# Patient Record
Sex: Female | Born: 1940 | Race: White | Hispanic: No | Marital: Married | Smoking: Never smoker
Health system: Southern US, Community
[De-identification: ages and names within clinical notes are randomized; demographics above are authoritative.]

---

## 2020-01-15 DIAGNOSIS — N1831 Chronic kidney disease, stage 3a: Secondary | ICD-10-CM | POA: Insufficient documentation

## 2020-01-15 DIAGNOSIS — E782 Mixed hyperlipidemia: Secondary | ICD-10-CM | POA: Insufficient documentation

## 2020-01-15 DIAGNOSIS — R7989 Other specified abnormal findings of blood chemistry: Secondary | ICD-10-CM | POA: Insufficient documentation

## 2020-01-15 DIAGNOSIS — G43909 Migraine, unspecified, not intractable, without status migrainosus: Secondary | ICD-10-CM | POA: Insufficient documentation

## 2020-01-15 DIAGNOSIS — N3946 Mixed incontinence: Secondary | ICD-10-CM | POA: Insufficient documentation

## 2020-01-15 DIAGNOSIS — F419 Anxiety disorder, unspecified: Secondary | ICD-10-CM | POA: Insufficient documentation

## 2020-01-17 DIAGNOSIS — G8929 Other chronic pain: Secondary | ICD-10-CM | POA: Insufficient documentation

## 2020-01-17 DIAGNOSIS — E039 Hypothyroidism, unspecified: Secondary | ICD-10-CM | POA: Insufficient documentation

## 2020-01-17 DIAGNOSIS — M5441 Lumbago with sciatica, right side: Secondary | ICD-10-CM | POA: Insufficient documentation

## 2020-01-17 DIAGNOSIS — M25559 Pain in unspecified hip: Secondary | ICD-10-CM | POA: Insufficient documentation

## 2020-01-17 DIAGNOSIS — G629 Polyneuropathy, unspecified: Secondary | ICD-10-CM | POA: Insufficient documentation

## 2020-05-03 DIAGNOSIS — K219 Gastro-esophageal reflux disease without esophagitis: Secondary | ICD-10-CM | POA: Insufficient documentation

## 2020-07-04 ENCOUNTER — Inpatient Hospital Stay (HOSPITAL_COMMUNITY)
Admission: EM | Admit: 2020-07-04 | Discharge: 2020-07-08 | DRG: 522 | Disposition: A | Discharge status: Skilled Nursing Facility | Payer: Medicare Other | Attending: Family Medicine | Admitting: Family Medicine

## 2020-07-04 ENCOUNTER — Emergency Department (HOSPITAL_COMMUNITY): Payer: Medicare Other

## 2020-07-04 ENCOUNTER — Encounter (HOSPITAL_COMMUNITY): Payer: Self-pay | Admitting: Emergency Medicine

## 2020-07-04 ENCOUNTER — Other Ambulatory Visit: Payer: Self-pay

## 2020-07-04 DIAGNOSIS — F419 Anxiety disorder, unspecified: Secondary | ICD-10-CM | POA: Diagnosis present

## 2020-07-04 DIAGNOSIS — G8929 Other chronic pain: Secondary | ICD-10-CM | POA: Diagnosis present

## 2020-07-04 DIAGNOSIS — M5441 Lumbago with sciatica, right side: Secondary | ICD-10-CM | POA: Diagnosis present

## 2020-07-04 DIAGNOSIS — I10 Essential (primary) hypertension: Secondary | ICD-10-CM

## 2020-07-04 DIAGNOSIS — Z7984 Long term (current) use of oral hypoglycemic drugs: Secondary | ICD-10-CM

## 2020-07-04 DIAGNOSIS — G5791 Unspecified mononeuropathy of right lower limb: Secondary | ICD-10-CM | POA: Diagnosis present

## 2020-07-04 DIAGNOSIS — R0602 Shortness of breath: Secondary | ICD-10-CM

## 2020-07-04 DIAGNOSIS — K219 Gastro-esophageal reflux disease without esophagitis: Secondary | ICD-10-CM | POA: Diagnosis present

## 2020-07-04 DIAGNOSIS — E782 Mixed hyperlipidemia: Secondary | ICD-10-CM | POA: Diagnosis present

## 2020-07-04 DIAGNOSIS — R7303 Prediabetes: Secondary | ICD-10-CM | POA: Diagnosis present

## 2020-07-04 DIAGNOSIS — W010XXA Fall on same level from slipping, tripping and stumbling without subsequent striking against object, initial encounter: Secondary | ICD-10-CM | POA: Diagnosis present

## 2020-07-04 DIAGNOSIS — J45909 Unspecified asthma, uncomplicated: Secondary | ICD-10-CM | POA: Diagnosis present

## 2020-07-04 DIAGNOSIS — E039 Hypothyroidism, unspecified: Secondary | ICD-10-CM | POA: Diagnosis present

## 2020-07-04 DIAGNOSIS — Z20822 Contact with and (suspected) exposure to covid-19: Secondary | ICD-10-CM | POA: Diagnosis present

## 2020-07-04 DIAGNOSIS — F32A Depression, unspecified: Secondary | ICD-10-CM | POA: Diagnosis present

## 2020-07-04 DIAGNOSIS — Z79899 Other long term (current) drug therapy: Secondary | ICD-10-CM | POA: Diagnosis not present

## 2020-07-04 DIAGNOSIS — Z79890 Hormone replacement therapy (postmenopausal): Secondary | ICD-10-CM

## 2020-07-04 DIAGNOSIS — Z96642 Presence of left artificial hip joint: Secondary | ICD-10-CM

## 2020-07-04 DIAGNOSIS — M25552 Pain in left hip: Secondary | ICD-10-CM | POA: Diagnosis present

## 2020-07-04 DIAGNOSIS — Z96641 Presence of right artificial hip joint: Secondary | ICD-10-CM | POA: Diagnosis present

## 2020-07-04 DIAGNOSIS — S72002A Fracture of unspecified part of neck of left femur, initial encounter for closed fracture: Principal | ICD-10-CM

## 2020-07-04 DIAGNOSIS — I9581 Postprocedural hypotension: Secondary | ICD-10-CM | POA: Diagnosis not present

## 2020-07-04 DIAGNOSIS — I129 Hypertensive chronic kidney disease with stage 1 through stage 4 chronic kidney disease, or unspecified chronic kidney disease: Secondary | ICD-10-CM | POA: Diagnosis present

## 2020-07-04 DIAGNOSIS — N1831 Chronic kidney disease, stage 3a: Secondary | ICD-10-CM | POA: Diagnosis present

## 2020-07-04 DIAGNOSIS — N3946 Mixed incontinence: Secondary | ICD-10-CM | POA: Diagnosis present

## 2020-07-04 LAB — CBC WITH DIFFERENTIAL/PLATELET
Abs Immature Granulocytes: 0.06 10*3/uL (ref 0.00–0.07)
Basophils Absolute: 0.1 10*3/uL (ref 0.0–0.1)
Basophils Relative: 1 %
Eosinophils Absolute: 0.1 10*3/uL (ref 0.0–0.5)
Eosinophils Relative: 2 %
HCT: 34.8 % — ABNORMAL LOW (ref 36.0–46.0)
Hemoglobin: 10.8 g/dL — ABNORMAL LOW (ref 12.0–15.0)
Immature Granulocytes: 1 %
Lymphocytes Relative: 21 %
Lymphs Abs: 1.6 10*3/uL (ref 0.7–4.0)
MCH: 26 pg (ref 26.0–34.0)
MCHC: 31 g/dL (ref 30.0–36.0)
MCV: 83.7 fL (ref 80.0–100.0)
Monocytes Absolute: 1 10*3/uL (ref 0.1–1.0)
Monocytes Relative: 13 %
Neutro Abs: 4.7 10*3/uL (ref 1.7–7.7)
Neutrophils Relative %: 62 %
Platelets: 286 10*3/uL (ref 150–400)
RBC: 4.16 MIL/uL (ref 3.87–5.11)
RDW: 15.8 % — ABNORMAL HIGH (ref 11.5–15.5)
WBC: 7.6 10*3/uL (ref 4.0–10.5)
nRBC: 0 % (ref 0.0–0.2)

## 2020-07-04 LAB — BASIC METABOLIC PANEL
Anion gap: 9 (ref 5–15)
BUN: 13 mg/dL (ref 8–23)
CO2: 22 mmol/L (ref 22–32)
Calcium: 9.1 mg/dL (ref 8.9–10.3)
Chloride: 102 mmol/L (ref 98–111)
Creatinine, Ser: 0.95 mg/dL (ref 0.44–1.00)
GFR, Estimated: >60 mL/min (ref >60)
Glucose, Bld: 82 mg/dL (ref 70–99)
Potassium: 4.8 mmol/L (ref 3.5–5.1)
Sodium: 133 mmol/L — ABNORMAL LOW (ref 135–145)

## 2020-07-04 LAB — RESP PANEL BY RT-PCR (FLU A&B, COVID) ARPGX2
Influenza A by PCR: NEGATIVE
Influenza B by PCR: NEGATIVE
SARS Coronavirus 2 by RT PCR: NEGATIVE

## 2020-07-04 LAB — HEMOGLOBIN A1C
Hgb A1c MFr Bld: 6.1 % — ABNORMAL HIGH (ref 4.8–5.6)
Mean Plasma Glucose: 128.37 mg/dL

## 2020-07-04 LAB — GLUCOSE, CAPILLARY: Glucose-Capillary: 118 mg/dL — ABNORMAL HIGH (ref 70–99)

## 2020-07-04 LAB — CBG MONITORING, ED: Glucose-Capillary: 92 mg/dL (ref 70–99)

## 2020-07-04 MED ORDER — DULOXETINE HCL 60 MG PO CPEP
60.0000 mg | ORAL_CAPSULE | Freq: Every day | ORAL | Status: DC
  Administered 2020-07-06 – 2020-07-08 (×3): 60 mg via ORAL
  Filled 2020-07-04 (×4): qty 1

## 2020-07-04 MED ORDER — ENOXAPARIN SODIUM 40 MG/0.4ML ~~LOC~~ SOLN: 40.0000 mg | SUBCUTANEOUS | Status: DC

## 2020-07-04 MED ORDER — HYDROCODONE-ACETAMINOPHEN 5-325 MG PO TABS
1.0000 | ORAL_TABLET | Freq: Four times a day (QID) | ORAL | Status: DC | PRN
  Administered 2020-07-04: 2 via ORAL
  Administered 2020-07-06: 1 via ORAL
  Administered 2020-07-06 (×2): 2 via ORAL
  Administered 2020-07-08 (×2): 1 via ORAL
  Filled 2020-07-04 (×2): qty 2
  Filled 2020-07-04: qty 1
  Filled 2020-07-04 (×2): qty 2
  Filled 2020-07-04: qty 1

## 2020-07-04 MED ORDER — LIDOCAINE-EPINEPHRINE 1 %-1:100000 IJ SOLN
10.0000 mL | Freq: Once | INTRAMUSCULAR | Status: DC
  Filled 2020-07-04: qty 10

## 2020-07-04 MED ORDER — METHOCARBAMOL 500 MG PO TABS
500.0000 mg | ORAL_TABLET | Freq: Four times a day (QID) | ORAL | Status: DC | PRN
  Administered 2020-07-04 – 2020-07-06 (×2): 500 mg via ORAL
  Filled 2020-07-04 (×2): qty 1

## 2020-07-04 MED ORDER — GABAPENTIN 100 MG PO CAPS
200.0000 mg | ORAL_CAPSULE | Freq: Every day | ORAL | Status: DC
Start: 2020-07-05 — End: 2020-07-08
  Administered 2020-07-06 – 2020-07-08 (×3): 200 mg via ORAL
  Filled 2020-07-04 (×4): qty 2

## 2020-07-04 MED ORDER — BISACODYL 5 MG PO TBEC: 5.0000 mg | DELAYED_RELEASE_TABLET | Freq: Every day | ORAL | Status: DC | PRN

## 2020-07-04 MED ORDER — HYDROMORPHONE HCL 1 MG/ML IJ SOLN
1.0000 mg | Freq: Once | INTRAMUSCULAR | Status: AC
Start: 2020-07-04 — End: 2020-07-04
  Administered 2020-07-04: 1 mg via INTRAVENOUS
  Filled 2020-07-04: qty 1

## 2020-07-04 MED ORDER — PRAVASTATIN SODIUM 10 MG PO TABS
20.0000 mg | ORAL_TABLET | Freq: Every day | ORAL | Status: DC
  Administered 2020-07-04 – 2020-07-07 (×4): 20 mg via ORAL
  Filled 2020-07-04 (×4): qty 2

## 2020-07-04 MED ORDER — INSULIN ASPART 100 UNIT/ML ~~LOC~~ SOLN: 0.0000 [IU] | Freq: Every day | SUBCUTANEOUS | Status: DC

## 2020-07-04 MED ORDER — ONDANSETRON HCL 4 MG/2ML IJ SOLN
4.0000 mg | Freq: Once | INTRAMUSCULAR | Status: AC
  Administered 2020-07-04: 4 mg via INTRAVENOUS
  Filled 2020-07-04: qty 2

## 2020-07-04 MED ORDER — INSULIN ASPART 100 UNIT/ML ~~LOC~~ SOLN: 0.0000 [IU] | Freq: Three times a day (TID) | SUBCUTANEOUS | Status: DC

## 2020-07-04 MED ORDER — SENNOSIDES-DOCUSATE SODIUM 8.6-50 MG PO TABS: 1.0000 | ORAL_TABLET | Freq: Every evening | ORAL | Status: DC | PRN

## 2020-07-04 MED ORDER — LIDOCAINE-EPINEPHRINE (PF) 2 %-1:200000 IJ SOLN
INTRAMUSCULAR | Status: AC
  Administered 2020-07-04: 20 mL
  Filled 2020-07-04: qty 20

## 2020-07-04 MED ORDER — HYDROMORPHONE HCL 1 MG/ML IJ SOLN
1.0000 mg | Freq: Once | INTRAMUSCULAR | Status: AC
  Administered 2020-07-04: 1 mg via INTRAVENOUS
  Filled 2020-07-04: qty 1

## 2020-07-04 MED ORDER — LEVOTHYROXINE SODIUM 137 MCG PO TABS
137.0000 ug | ORAL_TABLET | Freq: Every day | ORAL | Status: DC
  Administered 2020-07-06 – 2020-07-08 (×3): 137 ug via ORAL
  Filled 2020-07-04 (×3): qty 1

## 2020-07-04 MED ORDER — METHOCARBAMOL 1000 MG/10ML IJ SOLN
500.0000 mg | Freq: Four times a day (QID) | INTRAVENOUS | Status: DC | PRN
  Administered 2020-07-05: 500 mg via INTRAVENOUS
  Filled 2020-07-04: qty 5

## 2020-07-04 MED ORDER — B COMPLEX-C PO TABS
1.0000 | ORAL_TABLET | Freq: Every day | ORAL | Status: DC
  Administered 2020-07-06 – 2020-07-08 (×3): 1 via ORAL
  Filled 2020-07-04 (×4): qty 1

## 2020-07-04 MED ORDER — MORPHINE SULFATE (PF) 2 MG/ML IV SOLN
1.0000 mg | INTRAVENOUS | Status: DC | PRN
Start: 2020-07-04 — End: 2020-07-07
  Administered 2020-07-05 – 2020-07-07 (×2): 1 mg via INTRAVENOUS
  Filled 2020-07-04 (×2): qty 1

## 2020-07-04 MED ORDER — PANTOPRAZOLE SODIUM 40 MG PO TBEC
40.0000 mg | DELAYED_RELEASE_TABLET | Freq: Every day | ORAL | Status: DC
  Administered 2020-07-06 – 2020-07-08 (×3): 40 mg via ORAL
  Filled 2020-07-04 (×4): qty 1

## 2020-07-04 MED ORDER — MORPHINE SULFATE (PF) 2 MG/ML IV SOLN: 0.5000 mg | INTRAVENOUS | Status: DC | PRN

## 2020-07-04 MED ORDER — VITAMIN D 25 MCG (1000 UNIT) PO TABS
2000.0000 [IU] | ORAL_TABLET | Freq: Every day | ORAL | Status: DC
  Administered 2020-07-06 – 2020-07-08 (×3): 2000 [IU] via ORAL
  Filled 2020-07-04 (×4): qty 2

## 2020-07-04 MED ORDER — AMITRIPTYLINE HCL 10 MG PO TABS
10.0000 mg | ORAL_TABLET | Freq: Every day | ORAL | Status: DC
  Administered 2020-07-04 – 2020-07-07 (×4): 10 mg via ORAL
  Filled 2020-07-04 (×4): qty 1

## 2020-07-04 NOTE — H&P (Signed)
TRH H&P   Patient Demographics:    Laura Bennett, is a 80 y.o. female  MRN: [id number]   DOB - December 16, 1940  Admit Date - 07/04/2020  Outpatient Primary MD for the patient is Trisha Mangle, FNP  Referring MD/NP/PA: PA Humes  Patient coming from: Home  Chief Complaint  Patient presents with  . Fall  . Hip Pain      HPI:    Laura Bennett  is a 80 y.o. female, with past medical history of hyperlipidemia, anxiety, depression, prediabetes, CKD stage IIIa, recently moved from [place] in August 2022, and with history of elective right hip replacement due to osteoarthritis for years ago, patient presents secondary to fall and left hip pain, patient reports she tripped, fell on wood floor, hurt her left hip, with significant pain, she denies any head trauma, no loss of consciousness, no dizziness, lightheadedness, chest pain or shortness of breath, pain is constant, worsened by movement, she received fentanyl by EMS prior to arrival, she denies any tingling, numbness or paresthesia, no incontinence, no back pain. -In ED her work-up significant for left hip fracture, ED discussed with Dr. Romeo Apple, he recommended hospitalist admission, and plan for surgery tomorrow.    Review of systems:    In addition to the HPI above,  No Fever-chills, No Headache, No changes with Vision or hearing, No problems swallowing food or Liquids, No Chest pain, Cough or Shortness of Breath, No Abdominal pain, No Nausea or Vommitting, Bowel movements are regular, No Blood in stool or Urine, No dysuria, No new skin rashes or bruises, Complains of left hip knee pain. No new weakness, tingling, numbness in any extremity, No recent weight gain or loss, No polyuria, polydypsia or polyphagia, No significant Mental Stressors.  A full 10 point Review of Systems was done, except as stated above, all other  Review of Systems were negative.   With Past History of the following :    History reviewed. No pertinent past medical history.    History reviewed. No pertinent surgical history.    Social History:     Social History   Tobacco Use  . Smoking status: Never Smoker  . Smokeless tobacco: Never Used  Substance Use Topics  . Alcohol use: Not on file        Family History :    History reviewed. No pertinent family history.    Home Medications:   Prior to Admission medications   Medication Sig Start Date End Date Taking? Authorizing Provider  amitriptyline (ELAVIL) 10 MG tablet Take 10 mg by mouth at bedtime. 04/25/20   [provider]  B Complex Vitamins (VITAMIN B COMPLEX) TABS Take 1 tablet by mouth daily.    [provider]  Cholecalciferol 25 MCG (1000 UT) tablet Take 2,000 Units by mouth daily.    [provider]  diclofenac (VOLTAREN) 75 MG EC tablet Take 75  mg by mouth 2 (two) times daily. 04/25/20   [provider]  DULoxetine (CYMBALTA) 30 MG capsule Take 60 mg by mouth daily. 06/17/20   [provider]  estradiol (ESTRACE) 0.1 MG/GM vaginal cream Place 0.5 g vaginally daily. 07/03/20   [provider]  gabapentin (NEURONTIN) 100 MG capsule Take 200 mg by mouth daily. 05/27/20   [provider]  levothyroxine (SYNTHROID) 137 MCG tablet Take 137 mcg by mouth every morning. 04/04/20   [provider]  metFORMIN (GLUCOPHAGE-XR) 500 MG 24 hr tablet Take 500 mg by mouth every morning. 05/14/20   [provider]  omeprazole (PRILOSEC) 20 MG capsule Take 20 mg by mouth daily. 05/03/20   [provider]  oxybutynin (DITROPAN XL) 15 MG 24 hr tablet Take 15 mg by mouth daily. 03/25/20   [provider]  pravastatin (PRAVACHOL) 20 MG tablet Take 20 mg by mouth at bedtime. 04/25/20   [provider]     Allergies:    Not on File   Physical Exam:   Vitals  Blood pressure  135/80, pulse 88, temperature 98.8 F (37.1 C), resp. rate 19, height 5\' 4"  (1.626 m), weight 90.7 kg, SpO2 94 %.   1. General well developed female, laying in bed, no apparent distress  2. Normal affect and insight, Not Suicidal or Homicidal, Awake Alert, Oriented X 3.  3. No F.N deficits, ALL C.Nerves Intact, Strength 5/5 all 4 extremities, Sensation intact all 4 extremities, Plantars down going.  4. Ears and Eyes appear Normal, Conjunctivae clear, PERRLA. Moist Oral Mucosa.  5. Supple Neck, No JVD, No cervical lymphadenopathy appriciated, No Carotid Bruits.  6. Symmetrical Chest wall movement, Good air movement bilaterally, CTAB.  7. RRR, No Gallops, Rubs or Murmurs, No Parasternal Heave.  8. Positive Bowel Sounds, Abdomen Soft, No tenderness, No organomegaly appriciated,No rebound -guarding or rigidity.  9.  No Cyanosis, Normal Skin Turgor, No Skin Rash or Bruise.  10. Good muscle tone, good pulses bilaterally, left lower extremity shortened and rotated externally     Data Review:    CBC Recent Labs  Lab 07/04/20 1414  WBC 7.6  HGB 10.8*  HCT 34.8*  PLT 286  MCV 83.7  MCH 26.0  MCHC 31.0  RDW 15.8*  LYMPHSABS 1.6  MONOABS 1.0  EOSABS 0.1  BASOSABS 0.1   ------------------------------------------------------------------------------------------------------------------  Chemistries  Recent Labs  Lab 07/04/20 1414  NA 133*  K 4.8  CL 102  CO2 22  GLUCOSE 82  BUN 13  CREATININE 0.95  CALCIUM 9.1   ------------------------------------------------------------------------------------------------------------------ estimated creatinine clearance is 52.4 mL/min (by C-G formula based on SCr of 0.95 mg/dL). ------------------------------------------------------------------------------------------------------------------ No results for input(s): TSH, T4TOTAL, T3FREE, THYROIDAB in the last 72 hours.  Invalid input(s): FREET3  Coagulation profile No results for  input(s): INR, PROTIME in the last 168 hours. ------------------------------------------------------------------------------------------------------------------- No results for input(s): DDIMER in the last 72 hours. -------------------------------------------------------------------------------------------------------------------  Cardiac Enzymes No results for input(s): CKMB, TROPONINI, MYOGLOBIN in the last 168 hours.  Invalid input(s): CK ------------------------------------------------------------------------------------------------------------------ No results found for: BNP   ---------------------------------------------------------------------------------------------------------------  Urinalysis No results found for: COLORURINE, APPEARANCEUR, LABSPEC, PHURINE, GLUCOSEU, HGBUR, BILIRUBINUR, KETONESUR, PROTEINUR, UROBILINOGEN, NITRITE, LEUKOCYTESUR  ----------------------------------------------------------------------------------------------------------------   Imaging Results:    DG Chest 2 View  Result Date: 07/04/2020 CLINICAL DATA:  Status post fall. EXAM: CHEST - 2 VIEW COMPARISON:  None. FINDINGS: Hazy opacification of the mid and lower right lung is seen. An overlying, partially calcified right breast implant is also noted.  A left breast implant is seen lateral to the left hemithorax. A 1.2 cm x 0.8 cm focal opacity is seen overlying the lateral aspect of the left lung base. No pneumothorax is identified. The heart size and mediastinal contours are within normal limits. There is tortuosity of the descending thoracic aorta. Degenerative changes seen throughout the thoracic spine. IMPRESSION: 1. Partially calcified right breast implant. Mild airspace disease within the mid to lower right lung cannot be excluded. 2. Focal opacity overlying the left lung base which may represent an underlying lung nodule. Correlation with nonemergent chest CT is recommended. Electronically Signed    By: Aram Candela M.D.   On: 07/04/2020 15:29   DG Hip Unilat W or Wo Pelvis 2-3 Views Left  Result Date: 07/04/2020 CLINICAL DATA:  Status post fall. EXAM: DG HIP (WITH OR WITHOUT PELVIS) 2-3V LEFT COMPARISON:  None. FINDINGS: A total right hip replacement is seen. There is no evidence of surrounding lucency to suggest the presence of hardware loosening or infection. Acute fracture deformity is seen extending through the neck of the proximal left femur. Approximately 1/2 shaft width dorsal displacement of the distal fracture site is noted. There is no evidence of dislocation. IMPRESSION: Acute fracture of the proximal left femur. Electronically Signed   By: Aram Candela M.D.   On: 07/04/2020 15:25    My personal review of EKG: EKG is pending  Assessment & Plan:    Active Problems:   Closed left hip fracture [facility])   Essential hypertension   Prediabetes   Depression  Left Hip Fracture -Secondary to mechanical fall, ED physician discussed with Dr. Romeo Apple, plan for surgical repair tomorrow, will keep on current modified diet today, n.p.o. after midnight, admitted under fracture pathway, on as needed pain meds, nausea medications, currently bedrest. -EKG is pending, she denies any cardiac symptoms, she reports stress test few years ago with no acute findings.  Left lung base nodule -This is incidental finding on chest x-ray, will need CT chest on nonemergent patient, can be done as an outpatient, I have discussed with patient and her husband about importance to follow-up with PCP regarding CT chest. -Patient recently moved from [place], discussed with husband, he will try to check with her PCP in [place] if she had any previous chest x-ray in the past with similar findings. -She denies any history of smoking.  Anxiety/depression -Continue with home medications  Hyperlipidemia -Continue with statin  Pre Diabetes -Husband reports most recent A1c is 6.1, will monitor CBG, if  elevated will start on sliding scale  GERD -Continue with PPI  Stage IIIa CKD -At baseline, continue to monitor.  Thyroid disease -Continue with Synthroid  DVT Prophylaxis : Lovenox -to start tomorrow after surgery  AM Labs Ordered, also please review Full Orders  Family Communication: Admission, patients condition and plan of care including tests being ordered have been discussed with the patient and Husband at bedside who indicate understanding and agree with the plan and Code Status.  Code Status Full  Likely DC to  SNF  Condition GUARDED    Consults called: ortho Dr Romeo Apple by ED    Admission status: inpatient    Time spent in minutes : 60 minutes   Huey Bienenstock M.D on 07/04/2020 at 4:27 PM   [facility] - Office  [phone]

## 2020-07-04 NOTE — ED Notes (Signed)
Admitting provider at bedside.

## 2020-07-04 NOTE — ED Notes (Signed)
Pt placed on Purewick at this time.

## 2020-07-04 NOTE — ED Provider Notes (Signed)
[facility] EMERGENCY DEPARTMENT Provider Note   CSN: [id number] Arrival date & time: 07/04/20  1403     History Chief Complaint  Patient presents with  . Fall  . Hip Pain    Laura Bennett is a 80 y.o. female.   80 year old female with hx of preDM, HLD, CKD, anxiety and depression presents to the emergency department for evaluation of left hip pain.  She was ambulating in her home when she tripped and fell on a wood floor on her left hip.  She had no head trauma or loss of consciousness. States L hip pain has been constant, unchanged. It waxes and wanes in severity; aggravated by movement. She received Fentanyl by EMS PTA. No associated extremity numbness/paresthesias, incontinence, back pain. Denies chronic anticoagulant use. Hx of THA on the right 4 years ago in [place]. Denies being followed by an Orthopedist currently.  The history is provided by the patient. No language interpreter was used.  Fall  Hip Pain       History reviewed. No pertinent past medical history.  Patient Active Problem List   Diagnosis Date Noted  . Closed left hip fracture (HCC) 07/04/2020  . Essential hypertension 07/04/2020  . Prediabetes 07/04/2020  . Depression 07/04/2020    History reviewed. No pertinent surgical history.   OB History   No obstetric history on file.     History reviewed. No pertinent family history.  Social History   Tobacco Use  . Smoking status: Never Smoker  . Smokeless tobacco: Never Used    Home Medications Prior to Admission medications   Medication Sig Start Date End Date Taking? Authorizing Provider  amitriptyline (ELAVIL) 10 MG tablet Take 10 mg by mouth at bedtime. 04/25/20   [provider]  B Complex Vitamins (VITAMIN B COMPLEX) TABS Take 1 tablet by mouth daily.    [provider]  Cholecalciferol 25 MCG (1000 UT) tablet Take 2,000 Units by mouth daily.    [provider]  diclofenac (VOLTAREN) 75 MG EC tablet Take  75 mg by mouth 2 (two) times daily. 04/25/20   [provider]  DULoxetine (CYMBALTA) 30 MG capsule Take 60 mg by mouth daily. 06/17/20   [provider]  estradiol (ESTRACE) 0.1 MG/GM vaginal cream Place 0.5 g vaginally daily. 07/03/20   [provider]  gabapentin (NEURONTIN) 100 MG capsule Take 200 mg by mouth daily. 05/27/20   [provider]  levothyroxine (SYNTHROID) 137 MCG tablet Take 137 mcg by mouth every morning. 04/04/20   [provider]  metFORMIN (GLUCOPHAGE-XR) 500 MG 24 hr tablet Take 500 mg by mouth every morning. 05/14/20   [provider]  omeprazole (PRILOSEC) 20 MG capsule Take 20 mg by mouth daily. 05/03/20   [provider]  oxybutynin (DITROPAN XL) 15 MG 24 hr tablet Take 15 mg by mouth daily. 03/25/20   [provider]  pravastatin (PRAVACHOL) 20 MG tablet Take 20 mg by mouth at bedtime. 04/25/20   [provider]    Allergies    Patient has no allergy information on record.  Review of Systems   Review of Systems  Ten systems reviewed and are negative for acute change, except as noted in the HPI.    Physical Exam Updated Vital Signs BP 135/80   Pulse 88   Temp 98.8 F (37.1 C)   Resp 19   Ht 5\' 4"  (1.626 m)   Wt 90.7 kg   SpO2 94%   BMI 34.33  kg/m   Physical Exam Vitals and nursing note reviewed.  Constitutional:      General: She is not in acute distress.    Appearance: She is well-developed. She is not diaphoretic.     Comments: Obese, pleasant female. Intermittent discomfort due to hip pain. In NAD.  HENT:     Head: Normocephalic and atraumatic.  Eyes:     General: No scleral icterus.    Conjunctiva/sclera: Conjunctivae normal.  Cardiovascular:     Rate and Rhythm: Normal rate and regular rhythm.     Pulses: Normal pulses.     Comments: DP pulse 2+ in the LLE Pulmonary:     Effort: Pulmonary effort is normal. No respiratory distress.     Comments: Respirations even and   unlabored Musculoskeletal:     Cervical back: Normal range of motion.     Comments: LLE is shortened, externally rotated. TTP to the posterior L hip without crepitus, hematoma, contusion. LLE is warm, well perfused. Compartments soft.   Skin:    General: Skin is warm and dry.     Coloration: Skin is not pale.     Findings: No erythema or rash.  Neurological:     Mental Status: She is alert and oriented to person, place, and time.  Psychiatric:        Behavior: Behavior normal.     ED Results / Procedures / Treatments   Labs (all labs ordered are listed, but only abnormal results are displayed) Labs Reviewed  CBC WITH DIFFERENTIAL/PLATELET - Abnormal; Notable for the following components:      Result Value   Hemoglobin 10.8 (*)    HCT 34.8 (*)    RDW 15.8 (*)    All other components within normal limits  BASIC METABOLIC PANEL - Abnormal; Notable for the following components:   Sodium 133 (*)    All other components within normal limits  RESP PANEL BY RT-PCR (FLU A&B, COVID) ARPGX2  HEMOGLOBIN A1C  CBG MONITORING, ED    EKG EKG Interpretation  Date/Time:  Thursday July 04 2020 16:34:40 EDT Ventricular Rate:  83 PR Interval:    QRS Duration: 99 QT Interval:  400 QTC Calculation: 470 R Axis:   -64 Text Interpretation: Sinus rhythm Left anterior fascicular block Low voltage, precordial leads No old tracing to compare Confirmed by Eber Hong ([id number]) on 07/04/2020 5:19:20 PM   Radiology DG Chest 2 View  Result Date: 07/04/2020 CLINICAL DATA:  Status post fall. EXAM: CHEST - 2 VIEW COMPARISON:  None. FINDINGS: Hazy opacification of the mid and lower right lung is seen. An overlying, partially calcified right breast implant is also noted. A left breast implant is seen lateral to the left hemithorax. A 1.2 cm x 0.8 cm focal opacity is seen overlying the lateral aspect of the left lung base. No pneumothorax is identified. The heart size and mediastinal contours are within  normal limits. There is tortuosity of the descending thoracic aorta. Degenerative changes seen throughout the thoracic spine. IMPRESSION: 1. Partially calcified right breast implant. Mild airspace disease within the mid to lower right lung cannot be excluded. 2. Focal opacity overlying the left lung base which may represent an underlying lung nodule. Correlation with nonemergent chest CT is recommended. Electronically Signed   By: Aram Candela M.D.   On: 07/04/2020 15:29   DG Hip Unilat W or Wo Pelvis 2-3 Views Left  Result Date: 07/04/2020 CLINICAL DATA:  Status post fall. EXAM: DG HIP (WITH OR WITHOUT PELVIS) 2-3V  LEFT COMPARISON:  None. FINDINGS: A total right hip replacement is seen. There is no evidence of surrounding lucency to suggest the presence of hardware loosening or infection. Acute fracture deformity is seen extending through the neck of the proximal left femur. Approximately 1/2 shaft width dorsal displacement of the distal fracture site is noted. There is no evidence of dislocation. IMPRESSION: Acute fracture of the proximal left femur. Electronically Signed   By: Aram Candela M.D.   On: 07/04/2020 15:25    Procedures Procedures   Medications Ordered in ED Medications  HYDROcodone-acetaminophen (NORCO/VICODIN) 5-325 MG per tablet 1-2 tablet (has no administration in time range)  morphine 2 MG/ML injection 1 mg (has no administration in time range)  HYDROmorphone (DILAUDID) injection 1 mg (1 mg Intravenous Given 07/04/20 1433)  ondansetron (ZOFRAN) injection 4 mg (4 mg Intravenous Given 07/04/20 1430)  HYDROmorphone (DILAUDID) injection 1 mg (1 mg Intravenous Given 07/04/20 1626)    ED Course  I have reviewed the triage vital signs and the nursing notes.  Pertinent labs & imaging results that were available during my care of the patient were reviewed by me and considered in my medical decision making (see chart for details).  Clinical Course as of 07/04/20 1649  Thu Jul 04, 2020  1517 Xray reviewed by myself; c/w femoral neck fracture on the left. Pending formal read. Will consult Orthopedics. Plan for admission. [KH]  1533 Formal Xray read confirms L femoral neck fx. Ortho paged. [KH]  1544 Pain still rated at 5-6/10. Another does of pain medication ordered. [KH]  1554 Dr. Romeo Apple of Orthopedics aware of patient. Requests NPO after midnight. [KH]  1554 Admitting MD at bedside. [KH]    Clinical Course User Index [KH] Darylene Price   MDM Rules/Calculators/A&P                          80 year old female presents to the emergency department for chief complaint of left hip pain following a mechanical fall today.  She is neurovascularly intact.  Not on chronic anticoagulation.  Had no head trauma or LOC.  X-ray consistent with left femoral neck fracture.  History of prior right total hip arthroplasty in [place] 4 years ago.  She denies being actively followed by an orthopedist currently.  Dr. Romeo Apple of orthopedics aware of need for patient consultation.  Requests NPO after midnight with anticipation of operative repair tomorrow.  Will admit to the hospitalist service.   Final Clinical Impression(s) / ED Diagnoses Final diagnoses:  Displaced fracture of left femoral neck [facility])    Rx / DC Orders ED Discharge Orders    None       Antony Madura, PA-C 07/04/20 1721    Eber Hong, MD 07/04/20 [phone]

## 2020-07-04 NOTE — ED Notes (Signed)
ED Provider at bedside. 

## 2020-07-04 NOTE — ED Triage Notes (Signed)
Pt tripped and fell on the ground, injuring left hip. Shortening noted.

## 2020-07-04 NOTE — ED Notes (Addendum)
Patient transported to X-ray 

## 2020-07-04 NOTE — ED Notes (Signed)

## 2020-07-05 ENCOUNTER — Other Ambulatory Visit: Payer: Self-pay

## 2020-07-05 ENCOUNTER — Inpatient Hospital Stay (HOSPITAL_COMMUNITY): Payer: Medicare Other

## 2020-07-05 ENCOUNTER — Encounter (HOSPITAL_COMMUNITY): Payer: Self-pay | Admitting: Internal Medicine

## 2020-07-05 ENCOUNTER — Inpatient Hospital Stay (HOSPITAL_COMMUNITY): Payer: Medicare Other | Admitting: Anesthesiology

## 2020-07-05 ENCOUNTER — Encounter (HOSPITAL_COMMUNITY)
Admission: EM | Disposition: A | Discharge status: Skilled Nursing Facility | Payer: Self-pay | Source: Home / Self Care | Attending: Family Medicine

## 2020-07-05 HISTORY — PX: HIP ARTHROPLASTY: SHX981

## 2020-07-05 LAB — CBC
HCT: 36.2 % (ref 36.0–46.0)
Hemoglobin: 10.8 g/dL — ABNORMAL LOW (ref 12.0–15.0)
MCH: 25.6 pg — ABNORMAL LOW (ref 26.0–34.0)
MCHC: 29.8 g/dL — ABNORMAL LOW (ref 30.0–36.0)
MCV: 85.8 fL (ref 80.0–100.0)
Platelets: 243 10*3/uL (ref 150–400)
RBC: 4.22 MIL/uL (ref 3.87–5.11)
RDW: 16 % — ABNORMAL HIGH (ref 11.5–15.5)
WBC: 8 10*3/uL (ref 4.0–10.5)
nRBC: 0 % (ref 0.0–0.2)

## 2020-07-05 LAB — GLUCOSE, CAPILLARY
Glucose-Capillary: 92 mg/dL (ref 70–99)
Glucose-Capillary: 92 mg/dL (ref 70–99)
Glucose-Capillary: 110 mg/dL — ABNORMAL HIGH (ref 70–99)
Glucose-Capillary: 130 mg/dL — ABNORMAL HIGH (ref 70–99)

## 2020-07-05 LAB — PREPARE RBC (CROSSMATCH)

## 2020-07-05 LAB — BASIC METABOLIC PANEL
Anion gap: 11 (ref 5–15)
BUN: 14 mg/dL (ref 8–23)
CO2: 23 mmol/L (ref 22–32)
Calcium: 8.9 mg/dL (ref 8.9–10.3)
Chloride: 100 mmol/L (ref 98–111)
Creatinine, Ser: 1.03 mg/dL — ABNORMAL HIGH (ref 0.44–1.00)
GFR, Estimated: 55 mL/min — ABNORMAL LOW (ref >60)
Glucose, Bld: 101 mg/dL — ABNORMAL HIGH (ref 70–99)
Potassium: 4.5 mmol/L (ref 3.5–5.1)
Sodium: 134 mmol/L — ABNORMAL LOW (ref 135–145)

## 2020-07-05 LAB — SURGICAL PCR SCREEN
MRSA, PCR: NEGATIVE
Staphylococcus aureus: NEGATIVE

## 2020-07-05 LAB — ABO/RH: ABO/RH(D): O POS

## 2020-07-05 SURGERY — HEMIARTHROPLASTY, HIP, DIRECT ANTERIOR APPROACH, FOR FRACTURE
Anesthesia: General | Site: Hip | Laterality: Left

## 2020-07-05 MED ORDER — ASPIRIN EC 325 MG PO TBEC
325.0000 mg | DELAYED_RELEASE_TABLET | Freq: Every day | ORAL | Status: DC
  Administered 2020-07-06 – 2020-07-08 (×3): 325 mg via ORAL
  Filled 2020-07-05 (×3): qty 1

## 2020-07-05 MED ORDER — 0.9 % SODIUM CHLORIDE (POUR BTL) OPTIME
TOPICAL | Status: DC | PRN
  Administered 2020-07-05: 1000 mL

## 2020-07-05 MED ORDER — LABETALOL HCL 5 MG/ML IV SOLN: 10.0000 mg | INTRAVENOUS | Status: DC | PRN

## 2020-07-05 MED ORDER — PHENYLEPHRINE 40 MCG/ML (10ML) SYRINGE FOR IV PUSH (FOR BLOOD PRESSURE SUPPORT)
PREFILLED_SYRINGE | INTRAVENOUS | Status: AC
  Filled 2020-07-05: qty 10

## 2020-07-05 MED ORDER — INSULIN ASPART 100 UNIT/ML ~~LOC~~ SOLN: 0.0000 [IU] | Freq: Every day | SUBCUTANEOUS | Status: DC

## 2020-07-05 MED ORDER — MENTHOL 3 MG MT LOZG: 1.0000 | LOZENGE | OROMUCOSAL | Status: DC | PRN

## 2020-07-05 MED ORDER — FENTANYL CITRATE (PF) 100 MCG/2ML IJ SOLN
INTRAMUSCULAR | Status: AC
  Administered 2020-07-05: 50 ug via INTRAVENOUS
  Filled 2020-07-05: qty 2

## 2020-07-05 MED ORDER — ONDANSETRON HCL 4 MG/2ML IJ SOLN
INTRAMUSCULAR | Status: AC
  Filled 2020-07-05: qty 2

## 2020-07-05 MED ORDER — METOCLOPRAMIDE HCL 5 MG/ML IJ SOLN
5.0000 mg | Freq: Three times a day (TID) | INTRAMUSCULAR | Status: DC | PRN
Start: 2020-07-05 — End: 2020-07-08

## 2020-07-05 MED ORDER — CHLORHEXIDINE GLUCONATE 0.12 % MT SOLN: 15.0000 mL | Freq: Once | OROMUCOSAL | Status: DC

## 2020-07-05 MED ORDER — SUCCINYLCHOLINE CHLORIDE 200 MG/10ML IV SOSY
PREFILLED_SYRINGE | INTRAVENOUS | Status: AC
  Filled 2020-07-05: qty 10

## 2020-07-05 MED ORDER — PROPOFOL 10 MG/ML IV BOLUS
INTRAVENOUS | Status: DC | PRN
  Administered 2020-07-05: 130 mg via INTRAVENOUS

## 2020-07-05 MED ORDER — PHENYLEPHRINE 40 MCG/ML (10ML) SYRINGE FOR IV PUSH (FOR BLOOD PRESSURE SUPPORT)
PREFILLED_SYRINGE | INTRAVENOUS | Status: DC | PRN
  Administered 2020-07-05 (×4): 80 ug via INTRAVENOUS

## 2020-07-05 MED ORDER — SODIUM CHLORIDE 0.9 % IV BOLUS
500.0000 mL | Freq: Once | INTRAVENOUS | Status: AC
  Administered 2020-07-05: 500 mL via INTRAVENOUS

## 2020-07-05 MED ORDER — POVIDONE-IODINE 10 % EX SWAB: 2.0000 "application " | Freq: Once | CUTANEOUS | Status: DC

## 2020-07-05 MED ORDER — SODIUM CHLORIDE 0.9% IV SOLUTION: Freq: Once | INTRAVENOUS | Status: DC

## 2020-07-05 MED ORDER — TRAMADOL HCL 50 MG PO TABS
50.0000 mg | ORAL_TABLET | Freq: Four times a day (QID) | ORAL | Status: DC
  Administered 2020-07-06 – 2020-07-08 (×8): 50 mg via ORAL
  Filled 2020-07-05 (×10): qty 1

## 2020-07-05 MED ORDER — ONDANSETRON HCL 4 MG/2ML IJ SOLN: 4.0000 mg | Freq: Once | INTRAMUSCULAR | Status: DC | PRN

## 2020-07-05 MED ORDER — ACETAMINOPHEN 10 MG/ML IV SOLN
1000.0000 mg | Freq: Four times a day (QID) | INTRAVENOUS | Status: DC
  Administered 2020-07-05 – 2020-07-06 (×3): 1000 mg via INTRAVENOUS
  Filled 2020-07-05 (×6): qty 100

## 2020-07-05 MED ORDER — ONDANSETRON HCL 4 MG/2ML IJ SOLN: 4.0000 mg | Freq: Four times a day (QID) | INTRAMUSCULAR | Status: DC | PRN

## 2020-07-05 MED ORDER — ONDANSETRON HCL 4 MG PO TABS: 4.0000 mg | ORAL_TABLET | Freq: Four times a day (QID) | ORAL | Status: DC | PRN

## 2020-07-05 MED ORDER — LIDOCAINE 2% (20 MG/ML) 5 ML SYRINGE
INTRAMUSCULAR | Status: DC | PRN
  Administered 2020-07-05: 100 mg via INTRAVENOUS

## 2020-07-05 MED ORDER — CEFAZOLIN SODIUM-DEXTROSE 2-4 GM/100ML-% IV SOLN
INTRAVENOUS | Status: AC
  Administered 2020-07-05: 2 g via INTRAVENOUS
  Filled 2020-07-05: qty 100

## 2020-07-05 MED ORDER — ROCURONIUM BROMIDE 10 MG/ML (PF) SYRINGE
PREFILLED_SYRINGE | INTRAVENOUS | Status: AC
  Filled 2020-07-05: qty 10

## 2020-07-05 MED ORDER — PHENYLEPHRINE HCL (PRESSORS) 10 MG/ML IV SOLN
INTRAVENOUS | Status: AC
  Filled 2020-07-05: qty 1

## 2020-07-05 MED ORDER — PHENYLEPHRINE HCL-NACL 10-0.9 MG/250ML-% IV SOLN
INTRAVENOUS | Status: DC | PRN
  Administered 2020-07-05: 50 ug/min via INTRAVENOUS

## 2020-07-05 MED ORDER — CEFAZOLIN SODIUM-DEXTROSE 2-4 GM/100ML-% IV SOLN
2.0000 g | Freq: Four times a day (QID) | INTRAVENOUS | Status: AC
  Administered 2020-07-06: 2 g via INTRAVENOUS
  Filled 2020-07-05 (×2): qty 100

## 2020-07-05 MED ORDER — LIDOCAINE HCL (PF) 2 % IJ SOLN
INTRAMUSCULAR | Status: AC
  Filled 2020-07-05: qty 10

## 2020-07-05 MED ORDER — SODIUM CHLORIDE 0.9 % IV SOLN: INTRAVENOUS | Status: DC

## 2020-07-05 MED ORDER — HYDROMORPHONE HCL 1 MG/ML IJ SOLN
0.2500 mg | INTRAMUSCULAR | Status: DC | PRN
  Administered 2020-07-05 (×3): 0.5 mg via INTRAVENOUS
  Filled 2020-07-05 (×3): qty 0.5

## 2020-07-05 MED ORDER — FENTANYL CITRATE (PF) 250 MCG/5ML IJ SOLN
INTRAMUSCULAR | Status: DC | PRN
  Administered 2020-07-05 (×3): 50 ug via INTRAVENOUS

## 2020-07-05 MED ORDER — PHENOL 1.4 % MT LIQD: 1.0000 | OROMUCOSAL | Status: DC | PRN

## 2020-07-05 MED ORDER — METHOCARBAMOL 1000 MG/10ML IJ SOLN
INTRAMUSCULAR | Status: AC
  Filled 2020-07-05: qty 10

## 2020-07-05 MED ORDER — SUGAMMADEX SODIUM 200 MG/2ML IV SOLN
INTRAVENOUS | Status: DC | PRN
  Administered 2020-07-05: 200 mg via INTRAVENOUS

## 2020-07-05 MED ORDER — EPHEDRINE 5 MG/ML INJ
INTRAVENOUS | Status: AC
  Filled 2020-07-05: qty 10

## 2020-07-05 MED ORDER — FENTANYL CITRATE (PF) 100 MCG/2ML IJ SOLN
50.0000 ug | INTRAMUSCULAR | Status: AC | PRN
  Administered 2020-07-05: 50 ug via INTRAVENOUS

## 2020-07-05 MED ORDER — CHLORHEXIDINE GLUCONATE CLOTH 2 % EX PADS
6.0000 | MEDICATED_PAD | Freq: Every day | CUTANEOUS | Status: DC
  Administered 2020-07-05 – 2020-07-08 (×3): 6 via TOPICAL

## 2020-07-05 MED ORDER — ADULT MULTIVITAMIN W/MINERALS CH: 1.0000 | ORAL_TABLET | Freq: Every day | ORAL | Status: DC

## 2020-07-05 MED ORDER — DOCUSATE SODIUM 100 MG PO CAPS
100.0000 mg | ORAL_CAPSULE | Freq: Two times a day (BID) | ORAL | Status: DC
  Administered 2020-07-05 – 2020-07-08 (×6): 100 mg via ORAL
  Filled 2020-07-05 (×6): qty 1

## 2020-07-05 MED ORDER — ENSURE ENLIVE PO LIQD
237.0000 mL | Freq: Two times a day (BID) | ORAL | Status: DC
  Administered 2020-07-06 – 2020-07-08 (×5): 237 mL via ORAL
  Filled 2020-07-05: qty 237

## 2020-07-05 MED ORDER — SUCCINYLCHOLINE CHLORIDE 200 MG/10ML IV SOSY
PREFILLED_SYRINGE | INTRAVENOUS | Status: DC | PRN
  Administered 2020-07-05: 130 mg via INTRAVENOUS

## 2020-07-05 MED ORDER — SODIUM CHLORIDE 0.9 % IR SOLN
Status: DC | PRN
  Administered 2020-07-05: 3000 mL

## 2020-07-05 MED ORDER — METOCLOPRAMIDE HCL 10 MG PO TABS: 5.0000 mg | ORAL_TABLET | Freq: Three times a day (TID) | ORAL | Status: DC | PRN

## 2020-07-05 MED ORDER — SENNOSIDES-DOCUSATE SODIUM 8.6-50 MG PO TABS: 1.0000 | ORAL_TABLET | Freq: Every evening | ORAL | Status: DC | PRN

## 2020-07-05 MED ORDER — ORAL CARE MOUTH RINSE: 15.0000 mL | Freq: Once | OROMUCOSAL | Status: DC

## 2020-07-05 MED ORDER — LACTATED RINGERS IV SOLN: INTRAVENOUS | Status: DC

## 2020-07-05 MED ORDER — CEFAZOLIN SODIUM-DEXTROSE 2-4 GM/100ML-% IV SOLN
2.0000 g | INTRAVENOUS | Status: AC
  Administered 2020-07-05: 2 g via INTRAVENOUS
  Filled 2020-07-05: qty 100

## 2020-07-05 MED ORDER — ONDANSETRON HCL 4 MG/2ML IJ SOLN
INTRAMUSCULAR | Status: DC | PRN
  Administered 2020-07-05: 4 mg via INTRAVENOUS

## 2020-07-05 MED ORDER — ROCURONIUM BROMIDE 10 MG/ML (PF) SYRINGE
PREFILLED_SYRINGE | INTRAVENOUS | Status: DC | PRN
  Administered 2020-07-05: 60 mg via INTRAVENOUS
  Administered 2020-07-05 (×2): 20 mg via INTRAVENOUS

## 2020-07-05 MED ORDER — ALUM & MAG HYDROXIDE-SIMETH 200-200-20 MG/5ML PO SUSP
30.0000 mL | ORAL | Status: DC | PRN
  Administered 2020-07-05: 30 mL via ORAL
  Filled 2020-07-05: qty 30

## 2020-07-05 MED ORDER — BUPIVACAINE LIPOSOME 1.3 % IJ SUSP
INTRAMUSCULAR | Status: DC | PRN
  Administered 2020-07-05: 20 mL

## 2020-07-05 MED ORDER — FENTANYL CITRATE (PF) 100 MCG/2ML IJ SOLN
INTRAMUSCULAR | Status: AC
  Filled 2020-07-05: qty 2

## 2020-07-05 MED ORDER — INSULIN ASPART 100 UNIT/ML ~~LOC~~ SOLN: 0.0000 [IU] | Freq: Three times a day (TID) | SUBCUTANEOUS | Status: DC

## 2020-07-05 MED ORDER — CHLORHEXIDINE GLUCONATE 4 % EX LIQD: 60.0000 mL | Freq: Once | CUTANEOUS | Status: DC

## 2020-07-05 MED ORDER — CHLORHEXIDINE GLUCONATE 0.12 % MT SOLN
OROMUCOSAL | Status: AC
  Filled 2020-07-05: qty 15

## 2020-07-05 SURGICAL SUPPLY — 60 items
BIPOLAR DEPUY 47 (Hips) ×2 IMPLANT
BIT DRILL 2.8X128 (BIT) ×2 IMPLANT
BLADE HEX COATED 2.75 (ELECTRODE) ×2 IMPLANT
BLADE SAGITTAL 25.0X1.27X90 (BLADE) ×2 IMPLANT
CHLORAPREP W/TINT 26 (MISCELLANEOUS) ×2 IMPLANT
CLOTH BEACON ORANGE TIMEOUT ST (SAFETY) ×2 IMPLANT
COVER LIGHT HANDLE STERIS (MISCELLANEOUS) ×4 IMPLANT
COVER WAND RF STERILE (DRAPES) ×4 IMPLANT
DECANTER SPIKE VIAL GLASS SM (MISCELLANEOUS) ×2 IMPLANT
DRAPE BACK TABLE (DRAPES) ×2 IMPLANT
DRAPE HIP W/POCKET STRL (MISCELLANEOUS) ×2 IMPLANT
DRAPE U-SHAPE 47X51 STRL (DRAPES) ×2 IMPLANT
DRSG MEPILEX BORDER 4X12 (GAUZE/BANDAGES/DRESSINGS) ×2 IMPLANT
DRSG MEPILEX SACRM 8.7X9.8 (GAUZE/BANDAGES/DRESSINGS) ×2 IMPLANT
DURAPREP 26ML APPLICATOR (WOUND CARE) ×4 IMPLANT
ELECT REM PT RETURN 9FT ADLT (ELECTROSURGICAL) ×2
ELECTRODE REM PT RTRN 9FT ADLT (ELECTROSURGICAL) ×1 IMPLANT
GLOVE SKINSENSE NS SZ8.0 LF (GLOVE) ×2
GLOVE SKINSENSE STRL SZ8.0 LF (GLOVE) ×2 IMPLANT
GLOVE SS N UNI LF 8.5 STRL (GLOVE) ×2 IMPLANT
GLOVE SURG UNDER POLY LF SZ7 (GLOVE) ×8 IMPLANT
GOWN STRL REUS W/TWL LRG LVL3 (GOWN DISPOSABLE) ×6 IMPLANT
GOWN STRL REUS W/TWL XL LVL3 (GOWN DISPOSABLE) ×2 IMPLANT
HANDPIECE INTERPULSE COAX TIP (DISPOSABLE) ×1
HEAD BIPOLAR DEPUY 47 (Hips) IMPLANT
HEAD FEM STD 28X+1.5 STRL (Hips) ×1 IMPLANT
INST SET MAJOR BONE (KITS) ×2 IMPLANT
IV NS IRRIG 3000ML ARTHROMATIC (IV SOLUTION) ×2 IMPLANT
KIT BLADEGUARD II DBL (SET/KITS/TRAYS/PACK) ×2 IMPLANT
KIT TURNOVER KIT A (KITS) ×2 IMPLANT
MANIFOLD NEPTUNE II (INSTRUMENTS) ×2 IMPLANT
MARKER SKIN DUAL TIP RULER LAB (MISCELLANEOUS) ×2 IMPLANT
NDL HYPO 18GX1.5 BLUNT FILL (NEEDLE) ×1 IMPLANT
NDL HYPO 21X1.5 SAFETY (NEEDLE) ×1 IMPLANT
NEEDLE HYPO 18GX1.5 BLUNT FILL (NEEDLE) ×2 IMPLANT
NEEDLE HYPO 21X1.5 SAFETY (NEEDLE) ×2 IMPLANT
NS IRRIG 1000ML POUR BTL (IV SOLUTION) ×2 IMPLANT
PACK TOTAL JOINT (CUSTOM PROCEDURE TRAY) ×2 IMPLANT
PAD ARMBOARD 7.5X6 YLW CONV (MISCELLANEOUS) ×2 IMPLANT
PENCIL SMOKE EVACUATOR (MISCELLANEOUS) ×2 IMPLANT
PIN STMN SNGL STERILE 9X3.6MM (PIN) ×4 IMPLANT
SET BASIN LINEN APH (SET/KITS/TRAYS/PACK) ×2 IMPLANT
SET HNDPC FAN SPRY TIP SCT (DISPOSABLE) ×1 IMPLANT
SPONGE LAP 18X18 RF (DISPOSABLE) ×2 IMPLANT
STAPLER VISISTAT 35W (STAPLE) ×2 IMPLANT
STEM SUMMIT BASIC PRESSFIT SZ5 (Hips) ×1 IMPLANT
SUT BRALON NAB BRD #1 30IN (SUTURE) ×5 IMPLANT
SUT ETHIBOND 5 LR DA (SUTURE) ×4 IMPLANT
SUT MNCRL 0 VIOLET CTX 36 (SUTURE) ×1 IMPLANT
SUT MON AB 2-0 CT1 36 (SUTURE) ×2 IMPLANT
SUT MONOCRYL 0 CTX 36 (SUTURE) ×1
SUT VIC AB 1 CT1 27 (SUTURE) ×3
SUT VIC AB 1 CT1 27XBRD ANTBC (SUTURE) ×2 IMPLANT
SYR 20ML LL LF (SYRINGE) ×6 IMPLANT
SYR 30ML LL (SYRINGE) ×1 IMPLANT
SYR BULB IRRIG 60ML STRL (SYRINGE) ×2 IMPLANT
TOWEL OR 17X26 4PK STRL BLUE (TOWEL DISPOSABLE) ×2 IMPLANT
TRAY FOLEY MTR SLVR 16FR STAT (SET/KITS/TRAYS/PACK) ×2 IMPLANT
WATER STERILE IRR 1000ML POUR (IV SOLUTION) ×4 IMPLANT
YANKAUER SUCT 12FT TUBE ARGYLE (SUCTIONS) ×2 IMPLANT

## 2020-07-05 NOTE — Anesthesia Postprocedure Evaluation (Signed)
Anesthesia Post Note  Patient: Laura Bennett  Procedure(s) Performed: ARTHROPLASTY BIPOLAR HIP (HEMIARTHROPLASTY) (Left Hip)  Patient location during evaluation: Nursing Unit Anesthesia Type: General Level of consciousness: sedated and responds to stimulation Pain management: pain level controlled Vital Signs Assessment: post-procedure vital signs reviewed and stable Respiratory status: spontaneous breathing and patient connected to nasal cannula oxygen Cardiovascular status: stable (blood pressure low, nursing staff contacted Dr. Marisa Severin and bolus was ordered.) Postop Assessment: no apparent nausea or vomiting Anesthetic complications: no Comments: blood pressure low, nursing staff contacted Dr. Marisa Severin and bolus was ordered.   No complications documented.   Last Vitals:  Vitals:   07/05/20 1545 07/05/20 1620  BP: (!) 89/48 (!) 86/46  Pulse: 89 90  Resp: 12 14  Temp:  36.8 C  SpO2: 95% 90%    Last Pain:  Vitals:   07/05/20 1555  TempSrc:   PainSc: Asleep                 Rajamani C Battula

## 2020-07-05 NOTE — Transfer of Care (Signed)
Immediate Anesthesia Transfer of Care Note  Patient: Camelia Eng  Procedure(s) Performed: ARTHROPLASTY BIPOLAR HIP (HEMIARTHROPLASTY) (Left Hip)  Patient Location: PACU  Anesthesia Type:General  Level of Consciousness: sedated and patient cooperative  Airway & Oxygen Therapy: Patient Spontanous Breathing and Patient connected to nasal cannula oxygen  Post-op Assessment: Report given to RN, Post -op Vital signs reviewed and stable and Patient moving all extremities  Post vital signs: Reviewed and stable  Last Vitals:  Vitals Value Taken Time  BP 101/47 07/05/20 1419  Temp    Pulse 102 07/05/20 1424  Resp 18 07/05/20 1424  SpO2 96 % 07/05/20 1424  Vitals shown include unvalidated device data.  Last Pain:  Vitals:   07/05/20 1131  TempSrc: Oral  PainSc:       Patients Stated Pain Goal: 5 (07/05/20 1123)  Complications: No complications documented.

## 2020-07-05 NOTE — Progress Notes (Signed)
OT Cancellation Note  Patient Details Name: Laura Bennett MRN: [id number] DOB: [birth year]   Cancelled Treatment:    Reason Eval/Treat Not Completed: Patient not medically ready Patient will be having hip replacement today. Please place new orders for physical therapy when patient is ready.  Zephyra Bernardi OT, MOT  Danie Chandler 07/05/2020, 9:25 AM

## 2020-07-05 NOTE — Brief Op Note (Addendum)
07/05/2020  2:20 PM  PATIENT:  Laura Bennett  80 y.o. female  PRE-OPERATIVE DIAGNOSIS:  left hip fracture  POST-OPERATIVE DIAGNOSIS:  left hip fracture  PROCEDURE:  Procedure(s): ARTHROPLASTY BIPOLAR HIP (HEMIARTHROPLASTY) (Left)   FINDINGS: DISPLACED FRACTURE LEFT HIP  MINIMAL CARTILAGE EROSION IN THE ACETABULUM   5 SUMMIT BASIC PRESS FIT STEM , 1.5 NCK X 28 , 47 HEAD BIPOLAR   SURGEON:  Surgeon(s) and Role:    * Vickki Hearing, MD - Primary  PHYSICIAN ASSISTANT:   ASSISTANTS: cynthia wrenn   ANESTHESIA:   general  EBL:  150 mL   BLOOD ADMINISTERED:none  DRAINS: none   LOCAL MEDICATIONS USED:  OTHER exparel  SPECIMEN:  No Specimen  DISPOSITION OF SPECIMEN:  N/A  COUNTS:  YES  TOURNIQUET:  * No tourniquets in log *  DICTATION: .Dragon Dictation  PLAN OF CARE: Admit to inpatient   PATIENT DISPOSITION:  PACU - hemodynamically stable.   Delay start of Pharmacological VTE agent (>24hrs) due to surgical blood loss or risk of bleeding: yes

## 2020-07-05 NOTE — Progress Notes (Signed)
Patient Demographics:    Laura Bennett, is a 80 y.o. female, DOB - 09-23-1940, GEX:[id number]  Admit date - 07/04/2020   Admitting Physician Starleen Arms, MD  Outpatient Primary MD for the patient is Trisha Mangle, FNP  LOS - 1   Chief Complaint  Patient presents with  . Fall  . Hip Pain        Subjective:    Laura Bennett today has no fevers, no emesis,  No chest pain,   Husband at bedside, questions answered -Patient had episode of hypotension postop requiring IV fluid bolus  Assessment  & Plan :    Active Problems:   Closed left hip fracture [facility])   Essential hypertension   Prediabetes   Depression  Brief Summary:- 80 y.o. female, with past medical history of hyperlipidemia, anxiety, depression, prediabetes, CKD stage IIIa, recently moved from [place] in August 2022, and with history of elective right hip replacement due to osteoarthritis  A/p  1) left hip fracture--- status post mechanical fall  -status post ORIF by Dr. Romeo Apple on 07/05/2020 -Orthopedic surgeon recommends--weightbearing as tolerated Aspirin for 30 days for DVT prevention Staples out at 2 weeks Office visit at 4 weeks Direct lateral hip approach precautions -Management as per orthopedic team  2)Left lung base nodule -This is incidental finding on chest x-ray, will need CT chest on nonemergent patient, can be done as an outpatient,  --Patient and husband aware -Patient recently moved from [place], discussed with husband, he will try to check with her PCP in [place] if she had any previous chest x-ray in the past with similar findings. -She denies any history of smoking.  3)Anxiety/depression -Continue Cymbalta  4)Hyperlipidemia -Continue pravastatin  5)Pre Diabetes -Husband reports most recent A1c is 6.1,  Use Novolog/Humalog Sliding scale insulin with Accu-Cheks/Fingersticks as ordered    6)GERD -Continue with Protonix  7)Stage IIIa CKD -At baseline, continue to monitor.  8)Thyroid disease -Continue with levothyroxine 137 mcg  Disposition/Need for in-Hospital Stay- patient unable to be discharged at this time due to ---status post hip surgery requiring pain control and most likely SNF placement  Status is: Inpatient  Remains inpatient appropriate because:Please see above   Disposition: The patient is from: Home              Anticipated d/c is to: SNF              Anticipated d/c date is: 2 days              Patient currently is not medically stable to d/c. Barriers: Not Clinically Stable-   Code Status :  -  Code Status: Full Code   Family Communication:   (patient is alert, awake and coherent)  Discussed with husband  Consults  :  ortho  DVT Prophylaxis  :   - SCDs   SCDs Start: 07/05/20 1758 Place TED hose Start: 07/05/20 1758 SCDs Start: 07/04/20 1843    Lab Results  Component Value Date   PLT 243 07/05/2020    Inpatient Medications  Scheduled Meds: . sodium chloride   Intravenous Once  . amitriptyline  10 mg Oral QHS  . [START ON 07/06/2020] aspirin EC  325 mg Oral Q breakfast  . B-complex with vitamin  C  1 tablet Oral Daily  . chlorhexidine      . Chlorhexidine Gluconate Cloth  6 each Topical Daily  . cholecalciferol  2,000 Units Oral Daily  . docusate sodium  100 mg Oral BID  . DULoxetine  60 mg Oral Daily  . [START ON 07/06/2020] feeding supplement  237 mL Oral BID BM  . gabapentin  200 mg Oral Daily  . levothyroxine  137 mcg Oral Q0600  . lidocaine-EPINEPHrine  10 mL Other Once  . lidocaine-EPINEPHrine  10 mL Other Once  . pantoprazole  40 mg Oral Daily  . pravastatin  20 mg Oral QHS  . traMADol  50 mg Oral Q6H   Continuous Infusions: . sodium chloride 100 mL/hr at 07/05/20 1816  . acetaminophen 1,000 mg (07/05/20 1456)  . [COMPLETED] ceFAZolin 2 g (07/05/20 1813)  .  ceFAZolin (ANCEF) IV 2 g (07/05/20 1813)  . methocarbamol  (ROBAXIN) IV 500 mg (07/05/20 16100212)  . sodium chloride     PRN Meds:.alum & mag hydroxide-simeth, bisacodyl, HYDROcodone-acetaminophen, menthol-cetylpyridinium **OR** phenol, methocarbamol **OR** methocarbamol (ROBAXIN) IV, metoCLOPramide **OR** metoCLOPramide (REGLAN) injection, morphine injection, ondansetron **OR** ondansetron (ZOFRAN) IV, senna-docusate    Anti-infectives (From admission, onward)   Start     Dose/Rate Route Frequency Ordered Stop   07/05/20 1845  ceFAZolin (ANCEF) IVPB 2g/100 mL premix        2 g 200 mL/hr over 30 Minutes Intravenous Every 6 hours 07/05/20 1758 07/06/20 0644   07/05/20 1107  ceFAZolin (ANCEF) 2-4 GM/100ML-% IVPB       Note to Pharmacy: Waynard EdwardsMorris, Barbara   : cabinet override      07/05/20 1107 07/05/20 1843   07/05/20 0930  ceFAZolin (ANCEF) IVPB 2g/100 mL premix        2 g 200 mL/hr over 30 Minutes Intravenous On call to O.R. 07/05/20 0842 07/05/20 1220        Objective:   Vitals:   07/05/20 1515 07/05/20 1530 07/05/20 1545 07/05/20 1620  BP: (!) 99/52 (!) 80/49 (!) 89/48 (!) 86/46  Pulse: 82 82 89 90  Resp: 13 14 12 14   Temp:    98.3 F (36.8 C)  TempSrc:      SpO2: 97% 98% 95% 90%  Weight:      Height:        Wt Readings from Last 3 Encounters:  07/05/20 90 kg     Intake/Output Summary (Last 24 hours) at 07/05/2020 1833 Last data filed at 07/05/2020 1403 Gross per 24 hour  Intake 1150 ml  Output 1425 ml  Net -275 ml   Physical Exam Gen:- Awake Alert,  In no apparent distress  HEENT:- [place].AT, No sclera icterus Neck-Supple Neck,No JVD,.  Lungs-  CTAB , fair symmetrical air movement CV- S1, S2 normal, regular  Abd-  +ve B.Sounds, Abd Soft, No tenderness,    Extremity/Skin:- No  edema, pedal pulses present  Psych-affect is appropriate, oriented x3 Neuro-no new focal deficits, no tremors MSK--left hip postop wound intact, good pulses   Data Review:   Micro Results Recent Results (from the past 240 hour(s))  Resp Panel by  RT-PCR (Flu A&B, Covid) Nasopharyngeal Swab     Status: None   Collection Time: 07/04/20  4:49 PM   Specimen: Nasopharyngeal Swab; Nasopharyngeal(NP) swabs in vial transport medium  Result Value Ref Range Status   SARS Coronavirus 2 by RT PCR NEGATIVE NEGATIVE Final    Comment: (NOTE) SARS-CoV-2 target nucleic acids are NOT DETECTED.  The SARS-CoV-2  RNA is generally detectable in upper respiratory specimens during the acute phase of infection. The lowest concentration of SARS-CoV-2 viral copies this assay can detect is 138 copies/mL. A negative result does not preclude SARS-Cov-2 infection and should not be used as the sole basis for treatment or other patient management decisions. A negative result may occur with  improper specimen collection/handling, submission of specimen other than nasopharyngeal swab, presence of viral mutation(s) within the areas targeted by this assay, and inadequate number of viral copies(<138 copies/mL). A negative result must be combined with clinical observations, patient history, and epidemiological information. The expected result is Negative.  Fact Sheet for Patients:  BloggerCourse.com  Fact Sheet for Healthcare Providers:  SeriousBroker.it  This test is no t yet approved or cleared by the [place] FDA and  has been authorized for detection and/or diagnosis of SARS-CoV-2 by FDA under an Emergency Use Authorization (EUA). This EUA will remain  in effect (meaning this test can be used) for the duration of the COVID-19 declaration under Section 564(b)(1) of the Act, 21 U.S.C.section 360bbb-3(b)(1), unless the authorization is terminated  or revoked sooner.       Influenza A by PCR NEGATIVE NEGATIVE Final   Influenza B by PCR NEGATIVE NEGATIVE Final    Comment: (NOTE) The Xpert Xpress SARS-CoV-2/FLU/RSV plus assay is intended as an aid in the diagnosis of influenza from Nasopharyngeal swab  specimens and should not be used as a sole basis for treatment. Nasal washings and aspirates are unacceptable for Xpert Xpress SARS-CoV-2/FLU/RSV testing.  Fact Sheet for Patients: BloggerCourse.com  Fact Sheet for Healthcare Providers: SeriousBroker.it  This test is not yet approved or cleared by the [place] FDA and has been authorized for detection and/or diagnosis of SARS-CoV-2 by FDA under an Emergency Use Authorization (EUA). This EUA will remain in effect (meaning this test can be used) for the duration of the COVID-19 declaration under Section 564(b)(1) of the Act, 21 U.S.C. section 360bbb-3(b)(1), unless the authorization is terminated or revoked.  Performed at [facility], [address] [place], [place] [address]   Surgical PCR screen     Status: None   Collection Time: 07/04/20  9:10 PM   Specimen: Nasal Mucosa; Nasal Swab  Result Value Ref Range Status   MRSA, PCR NEGATIVE NEGATIVE Final   Staphylococcus aureus NEGATIVE NEGATIVE Final    Comment: (NOTE) The Xpert SA Assay (FDA approved for NASAL specimens in patients 68 years of age and older), is one component of a comprehensive surveillance program. It is not intended to diagnose infection nor to guide or monitor treatment. Performed at [facility], [address] [place], [place] [address]     Radiology Reports DG Chest 2 View  Result Date: 07/04/2020 CLINICAL DATA:  Status post fall. EXAM: CHEST - 2 VIEW COMPARISON:  None. FINDINGS: Hazy opacification of the mid and lower right lung is seen. An overlying, partially calcified right breast implant is also noted. A left breast implant is seen lateral to the left hemithorax. A 1.2 cm x 0.8 cm focal opacity is seen overlying the lateral aspect of the left lung base. No pneumothorax is identified. The heart size and mediastinal contours are within normal limits. There is tortuosity of the descending thoracic  aorta. Degenerative changes seen throughout the thoracic spine. IMPRESSION: 1. Partially calcified right breast implant. Mild airspace disease within the mid to lower right lung cannot be excluded. 2. Focal opacity overlying the left lung base which may represent an underlying lung  nodule. Correlation with nonemergent chest CT is recommended. Electronically Signed   By: Aram Candela M.D.   On: 07/04/2020 15:29   DG Pelvis Portable  Result Date: 07/05/2020 CLINICAL DATA:  Left hip replacement. EXAM: PORTABLE PELVIS 1-2 VIEWS COMPARISON:  Left hip x-rays from yesterday. FINDINGS: The left hip demonstrates a hemiarthroplasty without evidence of hardware failure or complication. There is no fracture or dislocation. The alignment is anatomic. Post-surgical changes noted in the surrounding soft tissues. Prior right total hip arthroplasty. IMPRESSION: 1. Interval left hip hemiarthroplasty without acute postoperative complication. Electronically Signed   By: Obie Dredge M.D.   On: 07/05/2020 15:47   DG Hip Unilat W or Wo Pelvis 2-3 Views Left  Result Date: 07/04/2020 CLINICAL DATA:  Status post fall. EXAM: DG HIP (WITH OR WITHOUT PELVIS) 2-3V LEFT COMPARISON:  None. FINDINGS: A total right hip replacement is seen. There is no evidence of surrounding lucency to suggest the presence of hardware loosening or infection. Acute fracture deformity is seen extending through the neck of the proximal left femur. Approximately 1/2 shaft width dorsal displacement of the distal fracture site is noted. There is no evidence of dislocation. IMPRESSION: Acute fracture of the proximal left femur. Electronically Signed   By: Aram Candela M.D.   On: 07/04/2020 15:25     CBC Recent Labs  Lab 07/04/20 1414 07/05/20 0620  WBC 7.6 8.0  HGB 10.8* 10.8*  HCT 34.8* 36.2  PLT 286 243  MCV 83.7 85.8  MCH 26.0 25.6*  MCHC 31.0 29.8*  RDW 15.8* 16.0*  LYMPHSABS 1.6  --   MONOABS 1.0  --   EOSABS 0.1  --   BASOSABS  0.1  --     Chemistries  Recent Labs  Lab 07/04/20 1414 07/05/20 0620  NA 133* 134*  K 4.8 4.5  CL 102 100  CO2 22 23  GLUCOSE 82 101*  BUN 13 14  CREATININE 0.95 1.03*  CALCIUM 9.1 8.9   ------------------------------------------------------------------------------------------------------------------ No results for input(s): CHOL, HDL, LDLCALC, TRIG, CHOLHDL, LDLDIRECT in the last 72 hours.  Lab Results  Component Value Date   HGBA1C 6.1 (H) 07/04/2020   ------------------------------------------------------------------------------------------------------------------ No results for input(s): TSH, T4TOTAL, T3FREE, THYROIDAB in the last 72 hours.  Invalid input(s): FREET3 ------------------------------------------------------------------------------------------------------------------ No results for input(s): VITAMINB12, FOLATE, FERRITIN, TIBC, IRON, RETICCTPCT in the last 72 hours.  Coagulation profile No results for input(s): INR, PROTIME in the last 168 hours.  No results for input(s): DDIMER in the last 72 hours.  Cardiac Enzymes No results for input(s): CKMB, TROPONINI, MYOGLOBIN in the last 168 hours.  Invalid input(s): CK ------------------------------------------------------------------------------------------------------------------ No results found for: BNP   Shon Hale M.D on 07/05/2020 at 6:33 PM  Go to [email or web address] - for contact info  [facility] - Office  [phone]

## 2020-07-05 NOTE — Anesthesia Preprocedure Evaluation (Signed)
Anesthesia Evaluation  Patient identified by MRN, date of birth, ID band Patient awake    Reviewed: Allergy & Precautions, NPO status , Patient's Chart, lab work & pertinent test results  History of Anesthesia Complications Negative for: history of anesthetic complications  Airway Mallampati: III  TM Distance: >3 FB Neck ROM: Full    Dental  (+) Dental Advisory Given, Missing Dental bite block for TMJ:   Pulmonary neg pulmonary ROS,    Pulmonary exam normal breath sounds clear to auscultation       Cardiovascular Exercise Tolerance: Good hypertension, Pt. on medications Normal cardiovascular exam Rhythm:Regular Rate:Normal     Neuro/Psych PSYCHIATRIC DISORDERS Depression negative neurological ROS     GI/Hepatic Neg liver ROS, GERD  Medicated,  Endo/Other  Hypothyroidism   Renal/GU Renal InsufficiencyRenal disease     Musculoskeletal  (+) Arthritis  (TMJ),   Abdominal   Peds  Hematology negative hematology ROS (+)   Anesthesia Other Findings   Reproductive/Obstetrics negative OB ROS                             Anesthesia Physical Anesthesia Plan  ASA: III  Anesthesia Plan: General   Post-op Pain Management:    Induction:   PONV Risk Score and Plan: Ondansetron and Dexamethasone  Airway Management Planned: Oral ETT  Additional Equipment:   Intra-op Plan:   Post-operative Plan: Extubation in OR  Informed Consent: I have reviewed the patients History and Physical, chart, labs and discussed the procedure including the risks, benefits and alternatives for the proposed anesthesia with the patient or authorized representative who has indicated his/her understanding and acceptance.     Dental advisory given  Plan Discussed with: CRNA and Surgeon  Anesthesia Plan Comments: (Patient refused spinal anesthesia)        Anesthesia Quick Evaluation

## 2020-07-05 NOTE — Care Management Important Message (Signed)
Important Message  Patient Details  Name: Laura Bennett MRN: [id number] Date of Birth: 1941/01/20   Medicare Important Message Given:  Yes     Corey Harold 07/05/2020, 2:32 PM

## 2020-07-05 NOTE — Op Note (Signed)
Operative report  July 05, 2020 time 2:41 PM  Operative report for Laura Bennett date of birth 11/14/1940 medical record #[id number]  Procedure bipolar replacement of the left hip  Preop diagnosis left hip femoral neck fracture  Postop diagnosis same  Procedure bipolar partial left hip replacement  Implants [employer] [place] basic press-fit size 5 fracture stem, 47 outer head, 28 mm inner head with 1.5 neck length  Surgeon Romeo Apple  Assisted by Canary Brim  Anesthetic General  Findings at surgery displaced left femoral neck fracture complete fracture.  Minimal changes of the acetabulum.  Therefore no total hip was done.  We also were concerned about the patient's obesity, right leg radiculopathy with potential for L5, or S1 root impingement and weakness of her abductors  She was seen in preop and the surgical site was confirmed and marked chart review was completed along with review of the images and implants were checked and available and ready  She was taken to the operating room for general anesthesia Foley catheter was inserted  She was placed lateral decubitus position with the left side up with appropriate padding  Upon positioning the patient in applying the Stulberg positioner the patient intertriginous zones separated and she had some bleeding around the abdominal folds.  She was prepped and draped sterilely timeout was completed  The leg was placed in flexion with the knee also flexed and a straight incision was made over the trochanter extended proximally and distally and an deepened down to the fascia.  The fascia was split in line with skin incision deep retractors were placed electrocautery was used to control bleeding  A bursectomy was done on the greater trochanteric bursa of the left hip.  The abductors were identified and then divided from the greater trochanter removing the anterior half and retracting it proximally preserving the hip capsule as we dissected.  2  Steinmann pins were placed in the pelvis to hold the abductors in place  The hip was dislocated with the femoral head.  The femoral head was measured at 47 mm.  The leg was then further rotated and placed in a sterile bag anteriorly and the proximal femur was prepared with neck cutting guide referencing the greater trochanter and a box osteotome canal initiate a canal finder and trochanteric reamer.  Serial broaching was performed up to a size 5 and then a trial reduction was performed with a size 5 femoral stem, 1.5 neck length in her head and a 47 outer head.  Trial reduction restore length, I was able to obtain a good shuck test normal flexion internal rotation external rotation and sleep position  Trial components were removed.  Drill holes were placed in the greater trochanter.  #5 Ethibond suture was passed.  The stem was placed without event.  Head was placed with the neck.  Hip was reduced.  Repeat reduction confirmed good stability and range of motion of the hip  Repair of the hip capsule was performed with #1 Vicryl followed by repair of the abductors  with #5 Ethibond suture and #1 Vicryl suture.  The leg was  abducted and the fascia was closed with #1 Braylon.  0 Monocryl was used to close subcutaneous tissue along with staples  We injected the soft tissues with exparel prior to closing the hip capsule  A sterile dressing was applied and we applied OpSite to the intertriginous zones that were torn during the positioning  Postop plan  She is weightbearing as tolerated Aspirin for 30 days  for DVT prevention Staples out at 2 weeks Office visit at 4 weeks Direct lateral hip approach precautions

## 2020-07-05 NOTE — Progress Notes (Signed)
Patient's BP is currently 86/46. MD Courage notified. IV bolus ordered.

## 2020-07-05 NOTE — Progress Notes (Addendum)
Initial Nutrition Assessment  DOCUMENTATION CODES:   Obesity unspecified  INTERVENTION:   -Once diet is advanced, add:   -Ensure Enlive po BID, each supplement provides 350 kcal and 20 grams of protein -B-complex with vitamin C daily  NUTRITION DIAGNOSIS:   Increased nutrient needs related to post-op healing as evidenced by estimated needs.  GOAL:   Patient will meet greater than or equal to 90% of their needs  MONITOR:   PO intake,Supplement acceptance,Diet advancement,Labs,Weight trends,Skin,I & O's  REASON FOR ASSESSMENT:   Consult Assessment of nutrition requirement/status,Hip fracture protocol  ASSESSMENT:   Laura Bennett  is a 80 y.o. female, with past medical history of hyperlipidemia, anxiety, depression, prediabetes, CKD stage IIIa, recently moved from [place] in August 2022, and with history of elective right hip replacement due to osteoarthritis for years ago, patient presents secondary to fall and left hip pain, patient reports she tripped, fell on wood floor, hurt her left hip, with significant pain,  Pt admitted with lt hip fracture secondary to mechanical fall.   Reviewed I/O's: -500 ml x 24 hours  UOP: 550 ml x 24 hours  Per orthopedics notes, partial pr total lt hip replacement planned for today. Pt is currently NPO for procedure.   Pt unavailable at time of visit (currently in OR). Unable to obtain further nutrition-related history or complete nutrition-focused physical exam at this time.   Reviewed wt hx from Care Everywhere; last documented wt was 98 kg (216#) on 05/03/20. Pt has experienced a 8.2% wt loss over the past 2 months, which is significant for time frame.   Pt with increased nutritional needs due to post-operative healing. Pt also at increased risk for malnutrition given recent wt loss. She would greatly benefit from addition of oral nutrition supplements.   Medications reviewed and include vitamin D3.   Labs reviewed: Na: 134.   Diet  Order:   Diet Order            Diet NPO time specified  Diet effective ____                 EDUCATION NEEDS:   No education needs have been identified at this time  Skin:  Skin Assessment: Reviewed RN Assessment  Last BM:  07/03/20  Height:   Ht Readings from Last 1 Encounters:  07/05/20 5\' 4"  (1.626 m)    Weight:   Wt Readings from Last 1 Encounters:  07/05/20 90 kg    Ideal Body Weight:  54.5 kg  BMI:  Body mass index is 34.06 kg/m.  Estimated Nutritional Needs:   Kcal:  1800-2000  Protein:  90-105 grams  Fluid:  > 1.8 L    07/07/20, RD, LDN, CDCES Registered Dietitian II Certified Diabetes Care and Education Specialist Please refer to [facility] for RD and/or RD on-call/weekend/after hours pager

## 2020-07-05 NOTE — Progress Notes (Signed)
PT Cancellation Note  Patient Details Name: Laura Bennett MRN: [id number] DOB: Jun 15, 1940   Cancelled Treatment:    Reason Eval/Treat Not Completed: Patient not medically ready; Patient will be having hip replacement today. Please place new orders for physical therapy when patient is ready.   8:12 AM, 07/05/20 Wyman Songster PT, DPT Physical Therapist at [facility]

## 2020-07-05 NOTE — Anesthesia Procedure Notes (Signed)
Procedure Name: Intubation Date/Time: 07/05/2020 12:12 PM Performed by: Lucinda Dell, CRNA Pre-anesthesia Checklist: Patient identified, Emergency Drugs available, Suction available and Patient being monitored Patient Re-evaluated:Patient Re-evaluated prior to induction Oxygen Delivery Method: Circle system utilized Preoxygenation: Pre-oxygenation with 100% oxygen Induction Type: IV induction Ventilation: Mask ventilation without difficulty Laryngoscope Size: Glidescope and 3 Tube type: Oral Tube size: 7.0 mm Number of attempts: 1 Airway Equipment and Method: Stylet and Video-laryngoscopy Placement Confirmation: ETT inserted through vocal cords under direct vision,  positive ETCO2 and breath sounds checked- equal and bilateral Secured at: 21 cm Tube secured with: Tape Dental Injury: Teeth and Oropharynx as per pre-operative assessment  Difficulty Due To: Difficulty was anticipated and Difficult Airway- due to limited oral opening Comments: Easy mask airway. Glidescope used d/t anticipated difficult airway. Good view with Glide. Atraumatic oral intubation.

## 2020-07-05 NOTE — [place] FL2 (Signed)
[place] MEDICAID FL2 LEVEL OF CARE SCREENING TOOL     IDENTIFICATION  Patient Name: Laura Bennett Birthdate: Aug 04, 1940 Sex: female Admission Date (Current Location): 07/04/2020  [facility] and [place] Number:  [employer] and Address:  [facility],  [address], [place] [address]      Provider Number: [id number]  Attending Physician Name and Address:  Shon Hale, MD  Relative Name and Phone Number:  Marlow Hendrie - husband  [phone]    Current Level of Care: Hospital Recommended Level of Care: Skilled Nursing Facility Prior Approval Number:    Date Approved/Denied:   PASRR Number: [id number]  Discharge Plan: SNF    Current Diagnoses: Patient Active Problem List   Diagnosis Date Noted  . Closed left hip fracture (HCC) 07/04/2020  . Essential hypertension 07/04/2020  . Prediabetes 07/04/2020  . Depression 07/04/2020    Orientation RESPIRATION BLADDER Height & Weight     Self,Time,Situation,Place  Normal External catheter Weight: 90 kg Height:  5\' 4"  (162.6 cm)  BEHAVIORAL SYMPTOMS/MOOD NEUROLOGICAL BOWEL NUTRITION STATUS      Continent Diet (See DC summary)  AMBULATORY STATUS COMMUNICATION OF NEEDS Skin   Extensive Assist Verbally Surgical wounds (left hip)                       Personal Care Assistance Level of Assistance  Bathing,Feeding,Dressing Bathing Assistance: Maximum assistance Feeding assistance: Independent Dressing Assistance: Maximum assistance     Functional Limitations Info  Sight,Hearing,Speech Sight Info: Adequate Hearing Info: Adequate Speech Info: Adequate    SPECIAL CARE FACTORS FREQUENCY  PT (By licensed PT)     PT Frequency: 5 times a week              Contractures Contractures Info: Not present    Additional Factors Info  Code Status,Allergies Code Status Info: FULL Allergies Info: NKDA           Current Medications (07/05/2020):  This is the current hospital active  medication list Current Facility-Administered Medications  Medication Dose Route Frequency Provider Last Rate Last Admin  . 0.9 %  sodium chloride infusion (Manually program via Guardrails IV Fluids)   Intravenous Once 07/07/2020, MD      . Molli Barrows Hold] 0.9 %  sodium chloride infusion (Manually program via Guardrails IV Fluids)   Intravenous Once Mitzi Hansen, MD      . 0.9 % irrigation (POUR BTL)    PRN Vickki Hearing, MD   1,000 mL at 07/05/20 1249  . [MAR Hold] amitriptyline (ELAVIL) tablet 10 mg  10 mg Oral QHS Elgergawy, 07/07/20, MD   10 mg at 07/04/20 2108  . [MAR Hold] B-complex with vitamin C tablet 1 tablet  1 tablet Oral Daily Elgergawy, 2109, MD      . Leana Roe Hold] bisacodyl (DULCOLAX) EC tablet 5 mg  5 mg Oral Daily PRN Elgergawy, Mitzi Hansen, MD      . chlorhexidine (HIBICLENS) 4 % liquid 4 application  60 mL Topical Once Leana Roe, MD      . chlorhexidine (PERIDEX) 0.12 % solution 15 mL  15 mL Mouth/Throat Once Vickki Hearing, MD       Or  . MEDLINE mouth rinse  15 mL Mouth Rinse Once Battula, Rajamani C, MD      . chlorhexidine (PERIDEX) 0.12 % solution           . [MAR Hold] cholecalciferol (VITAMIN D3) tablet 2,000  Units  2,000 Units Oral Daily Elgergawy, Leana Roe, MD      . Mitzi Hansen Hold] DULoxetine (CYMBALTA) DR capsule 60 mg  60 mg Oral Daily Elgergawy, Leana Roe, MD      . Melene Muller ON 07/06/2020] feeding supplement (ENSURE ENLIVE / ENSURE PLUS) liquid 237 mL  237 mL Oral BID BM Emokpae, Courage, MD      . Mitzi Hansen Hold] gabapentin (NEURONTIN) capsule 200 mg  200 mg Oral Daily Elgergawy, Leana Roe, MD      . Mitzi Hansen Hold] HYDROcodone-acetaminophen (NORCO/VICODIN) 5-325 MG per tablet 1-2 tablet  1-2 tablet Oral Q6H PRN Elgergawy, Leana Roe, MD   2 tablet at 07/04/20 2355  . lactated ringers infusion   Intravenous Continuous Molli Barrows, MD 50 mL/hr at 07/05/20 1202 Continued from Pre-op at 07/05/20 1202  . [MAR Hold] levothyroxine (SYNTHROID) tablet 137  mcg  137 mcg Oral Q0600 Elgergawy, Leana Roe, MD      . Mitzi Hansen Hold] lidocaine-EPINEPHrine (XYLOCAINE W/EPI) 1 %-1:100000 (with pres) injection 10 mL  10 mL Other Once Eber Hong, MD      . Mitzi Hansen Hold] lidocaine-EPINEPHrine (XYLOCAINE W/EPI) 1 %-1:100000 (with pres) injection 10 mL  10 mL Other Once Eber Hong, MD      . Mitzi Hansen Hold] methocarbamol (ROBAXIN) tablet 500 mg  500 mg Oral Q6H PRN Elgergawy, Leana Roe, MD   500 mg at 07/04/20 2015   Or  . [MAR Hold] methocarbamol (ROBAXIN) 500 mg in dextrose 5 % 50 mL IVPB  500 mg Intravenous Q6H PRN Elgergawy, Leana Roe, MD 100 mL/hr at 07/05/20 0212 500 mg at 07/05/20 7616  . [MAR Hold] morphine 2 MG/ML injection 1 mg  1 mg Intravenous Q2H PRN Elgergawy, Leana Roe, MD   1 mg at 07/05/20 0737  . [MAR Hold] pantoprazole (PROTONIX) EC tablet 40 mg  40 mg Oral Daily Elgergawy, Dawood S, MD      . povidone-iodine 10 % swab 2 application  2 application Topical Once Vickki Hearing, MD      . Mitzi Hansen Hold] pravastatin (PRAVACHOL) tablet 20 mg  20 mg Oral QHS Elgergawy, Leana Roe, MD   20 mg at 07/04/20 2108  . [MAR Hold] senna-docusate (Senokot-S) tablet 1 tablet  1 tablet Oral QHS PRN Elgergawy, Leana Roe, MD       Facility-Administered Medications Ordered in Other Encounters  Medication Dose Route Frequency Provider Last Rate Last Admin  . fentaNYL citrate (PF) (SUBLIMAZE) injection   Intravenous Anesthesia Intra-op Lucinda Dell, CRNA   50 mcg at 07/05/20 1324  . lidocaine 2% (20 mg/mL) 5 mL syringe   Intravenous Anesthesia Intra-op Lucinda Dell, CRNA   100 mg at 07/05/20 1211  . phenylephrine (NEOSYNEPHRINE) 10-0.9 MG/250ML-% infusion   Intravenous Continuous PRN Lucinda Dell, CRNA 150 mL/hr at 07/05/20 1330 100 mcg/min at 07/05/20 1330  . PHENYLephrine 40 mcg/ml in normal saline Adult IV Push Syringe (For Blood Pressure Support)   Intravenous Anesthesia Intra-op Lucinda Dell, CRNA   80 mcg at 07/05/20 1331  . propofol (DIPRIVAN) 10  mg/mL bolus/IV push   Intravenous Anesthesia Intra-op Lucinda Dell, CRNA   130 mg at 07/05/20 1211  . rocuronium bromide 10 mg/mL (PF) syringe   Intravenous Anesthesia Intra-op Lucinda Dell, CRNA   20 mg at 07/05/20 1324  . succinylcholine (ANECTINE) syringe   Intravenous Anesthesia Intra-op Lucinda Dell, CRNA   130 mg at 07/05/20 1211     Discharge Medications: Please see  discharge summary for a list of discharge medications.  Relevant Imaging Results:  Relevant Lab Results:   Additional Information SS# [id number]  Leitha Bleak, RN

## 2020-07-05 NOTE — Interval H&P Note (Signed)
History and Physical Interval Note:  07/05/2020 11:57 AM  Laura Bennett  has presented today for surgery, with the diagnosis of fracture left hip.  The various methods of treatment have been discussed with the patient and family. After consideration of risks, benefits and other options for treatment, the patient has consented to  Procedure(s): ARTHROPLASTY BIPOLAR HIP (HEMIARTHROPLASTY) (Left) TOTAL HIP ARTHROPLASTY (Left) as a surgical intervention.  The patient's history has been reviewed, patient examined, no change in status, stable for surgery.  I have reviewed the patient's chart and labs.  Questions were answered to the patient's satisfaction.     Fuller Canada

## 2020-07-05 NOTE — H&P (View-Only) (Signed)
Laura Bennett  07/05/2020  Body mass index is 34.33 kg/m.  ASSESSMENT AND PLAN:     Left hip fracture   Left hip, partial or total hip replacement   The procedure has been fully reviewed with the patient; The risks and benefits of surgery have been discussed and explained and understood. Alternative treatment has also been reviewed, questions were encouraged and answered. The postoperative plan is also been reviewed.  bledeing infection blood clot leg length difference dislocation are potential complications     HISTORY SECTION :  Chief Complaint  Patient presents with  . Fall  . Hip Pain   HPI The patient presents for evaluation of  Severe left hip pain x 1 day after mechanical fall  H/o right tha for OA 4 yrs ago complicated by neuropathy right leg   Occasionally uses a cane   Review of Systems  Musculoskeletal: Positive for back pain and joint pain.  Neurological: Positive for sensory change.  All other systems reviewed and are negative.   PMH/PSH: Past Medical History:  Diagnosis Date  . Anxiety  . Asthma  . Depression  . Elevated LFTs  . Migraines  . Mixed hyperlipidemia  . Mixed stress and urge incontinence  . Prediabetes  . Stage 3a chronic kidney disease (HCC)  . Thyroid disease   Patient Active Problem List  Diagnosis  . Mixed hyperlipidemia  . Stage 3a chronic kidney disease (HCC)  . Elevated LFTs  . Migraines  . Depression  . Mixed stress and urge incontinence  . Anxiety  . Prediabetes  . Neuropathy  . Hip pain  . Chronic midline low back pain with right-sided sciatica  . Acquired hypothyroidism   Past Surgical History:  Procedure Laterality Date  . BOTOX INJECTION  bladder  . CESAREAN SECTION  emergency  . TUBAL LIGATION           Social History   Tobacco Use  . Smoking status: Never Smoker  . Smokeless tobacco: Never Used    History reviewed. No pertinent family history.  She has neuropathy hypothyroidism kidney  disease   S/p right tha   No Known Allergies   Current Facility-Administered Medications:  .  0.9 %  sodium chloride infusion (Manually program via Guardrails IV Fluids), , Intravenous, Once, Battula, Rajamani C, MD .  0.9 %  sodium chloride infusion (Manually program via Guardrails IV Fluids), , Intravenous, Once, Vickki Hearing, MD .  amitriptyline (ELAVIL) tablet 10 mg, 10 mg, Oral, QHS, Elgergawy, Leana Roe, MD, 10 mg at 07/04/20 2108 .  B-complex with vitamin C tablet 1 tablet, 1 tablet, Oral, Daily, Elgergawy, Dawood S, MD .  bisacodyl (DULCOLAX) EC tablet 5 mg, 5 mg, Oral, Daily PRN, Elgergawy, Dawood S, MD .  cholecalciferol (VITAMIN D3) tablet 2,000 Units, 2,000 Units, Oral, Daily, Elgergawy, Dawood S, MD .  DULoxetine (CYMBALTA) DR capsule 60 mg, 60 mg, Oral, Daily, Elgergawy, Dawood S, MD .  enoxaparin (LOVENOX) injection 40 mg, 40 mg, Subcutaneous, Q24H, Elgergawy, Dawood S, MD .  gabapentin (NEURONTIN) capsule 200 mg, 200 mg, Oral, Daily, Elgergawy, Leana Roe, MD .  HYDROcodone-acetaminophen (NORCO/VICODIN) 5-325 MG per tablet 1-2 tablet, 1-2 tablet, Oral, Q6H PRN, Elgergawy, Leana Roe, MD, 2 tablet at 07/04/20 2355 .  levothyroxine (SYNTHROID) tablet 137 mcg, 137 mcg, Oral, Q0600, Elgergawy, Leana Roe, MD .  lidocaine-EPINEPHrine (XYLOCAINE W/EPI) 1 %-1:100000 (with pres) injection 10 mL, 10 mL, Other, Once, Eber Hong, MD .  lidocaine-EPINEPHrine (XYLOCAINE W/EPI) 1 %-1:100000 (with pres)  injection 10 mL, 10 mL, Other, Once, Eber Hong, MD .  methocarbamol (ROBAXIN) tablet 500 mg, 500 mg, Oral, Q6H PRN, 500 mg at 07/04/20 2015 **OR** methocarbamol (ROBAXIN) 500 mg in dextrose 5 % 50 mL IVPB, 500 mg, Intravenous, Q6H PRN, Elgergawy, Leana Roe, MD, Last Rate: 100 mL/hr at 07/05/20 0212, 500 mg at 07/05/20 0212 .  morphine 2 MG/ML injection 1 mg, 1 mg, Intravenous, Q2H PRN, Elgergawy, Leana Roe, MD, 1 mg at 07/05/20 1540 .  pantoprazole (PROTONIX) EC tablet 40 mg, 40 mg, Oral,  Daily, Elgergawy, Dawood S, MD .  pravastatin (PRAVACHOL) tablet 20 mg, 20 mg, Oral, QHS, Elgergawy, Leana Roe, MD, 20 mg at 07/04/20 2108 .  senna-docusate (Senokot-S) tablet 1 tablet, 1 tablet, Oral, QHS PRN, Elgergawy, Leana Roe, MD   PHYSICAL EXAM SECTION: BP 112/87 (BP Location: Right Wrist)   Pulse 79   Temp 98.1 F (36.7 C) (Oral)   Resp 18   Ht 5\' 4"  (1.626 m)   Wt 90.7 kg   SpO2 94%   BMI 34.33 kg/m   Body mass index is 34.33 kg/m.   General appearance: Well-developed well-nourished no gross deformities  Eyes clear normal vision no evidence of conjunctivitis or jaundice, extraocular muscles intact  ENT: ears hearing normal, nasal passages clear, throat clear   Lymph nodes: No lymphadenopathy  Neck is supple without palpable mass, full range of motion  Cardiovascular normal pulse and perfusion in all 4 extremities normal color without edema  Neurologically deep tendon reflexes are equal and normal, no sensation loss or deficits no pathologic reflexes  Psychological: Awake alert and oriented x3 mood and affect normal  Skin no lacerations or ulcerations no nodularity no palpable masses, no erythema or nodularity  Musculoskeletal:  Left leg is tender at the hip  The skin is normal The leg is short snd external rotation Knee ankle stable Muscle tone normal  Right thumb splint  Other extremities normal    7:30 AM

## 2020-07-05 NOTE — Consult Note (Signed)
Tarrin Guardino  07/05/2020  Body mass index is 34.33 kg/m.  ASSESSMENT AND PLAN:     Left hip fracture   Left hip, partial or total hip replacement   The procedure has been fully reviewed with the patient; The risks and benefits of surgery have been discussed and explained and understood. Alternative treatment has also been reviewed, questions were encouraged and answered. The postoperative plan is also been reviewed.  bledeing infection blood clot leg length difference dislocation are potential complications     HISTORY SECTION :  Chief Complaint  Patient presents with  . Fall  . Hip Pain   HPI The patient presents for evaluation of  Severe left hip pain x 1 day after mechanical fall  H/o right tha for OA 4 yrs ago complicated by neuropathy right leg   Occasionally uses a cane   Review of Systems  Musculoskeletal: Positive for back pain and joint pain.  Neurological: Positive for sensory change.  All other systems reviewed and are negative.   PMH/PSH: Past Medical History:  Diagnosis Date  . Anxiety  . Asthma  . Depression  . Elevated LFTs  . Migraines  . Mixed hyperlipidemia  . Mixed stress and urge incontinence  . Prediabetes  . Stage 3a chronic kidney disease (HCC)  . Thyroid disease   Patient Active Problem List  Diagnosis  . Mixed hyperlipidemia  . Stage 3a chronic kidney disease (HCC)  . Elevated LFTs  . Migraines  . Depression  . Mixed stress and urge incontinence  . Anxiety  . Prediabetes  . Neuropathy  . Hip pain  . Chronic midline low back pain with right-sided sciatica  . Acquired hypothyroidism   Past Surgical History:  Procedure Laterality Date  . BOTOX INJECTION  bladder  . CESAREAN SECTION  emergency  . TUBAL LIGATION           Social History   Tobacco Use  . Smoking status: Never Smoker  . Smokeless tobacco: Never Used    History reviewed. No pertinent family history.  She has neuropathy hypothyroidism kidney  disease   S/p right tha   No Known Allergies   Current Facility-Administered Medications:  .  0.9 %  sodium chloride infusion (Manually program via Guardrails IV Fluids), , Intravenous, Once, Battula, Rajamani C, MD .  0.9 %  sodium chloride infusion (Manually program via Guardrails IV Fluids), , Intravenous, Once, Yulisa Chirico E, MD .  amitriptyline (ELAVIL) tablet 10 mg, 10 mg, Oral, QHS, Elgergawy, Dawood S, MD, 10 mg at 07/04/20 2108 .  B-complex with vitamin C tablet 1 tablet, 1 tablet, Oral, Daily, Elgergawy, Dawood S, MD .  bisacodyl (DULCOLAX) EC tablet 5 mg, 5 mg, Oral, Daily PRN, Elgergawy, Dawood S, MD .  cholecalciferol (VITAMIN D3) tablet 2,000 Units, 2,000 Units, Oral, Daily, Elgergawy, Dawood S, MD .  DULoxetine (CYMBALTA) DR capsule 60 mg, 60 mg, Oral, Daily, Elgergawy, Dawood S, MD .  enoxaparin (LOVENOX) injection 40 mg, 40 mg, Subcutaneous, Q24H, Elgergawy, Dawood S, MD .  gabapentin (NEURONTIN) capsule 200 mg, 200 mg, Oral, Daily, Elgergawy, Dawood S, MD .  HYDROcodone-acetaminophen (NORCO/VICODIN) 5-325 MG per tablet 1-2 tablet, 1-2 tablet, Oral, Q6H PRN, Elgergawy, Dawood S, MD, 2 tablet at 07/04/20 2355 .  levothyroxine (SYNTHROID) tablet 137 mcg, 137 mcg, Oral, Q0600, Elgergawy, Dawood S, MD .  lidocaine-EPINEPHrine (XYLOCAINE W/EPI) 1 %-1:100000 (with pres) injection 10 mL, 10 mL, Other, Once, Miller, Brian, MD .  lidocaine-EPINEPHrine (XYLOCAINE W/EPI) 1 %-1:100000 (with pres)   injection 10 mL, 10 mL, Other, Once, Eber Hong, MD .  methocarbamol (ROBAXIN) tablet 500 mg, 500 mg, Oral, Q6H PRN, 500 mg at 07/04/20 2015 **OR** methocarbamol (ROBAXIN) 500 mg in dextrose 5 % 50 mL IVPB, 500 mg, Intravenous, Q6H PRN, Elgergawy, Leana Roe, MD, Last Rate: 100 mL/hr at 07/05/20 0212, 500 mg at 07/05/20 0212 .  morphine 2 MG/ML injection 1 mg, 1 mg, Intravenous, Q2H PRN, Elgergawy, Leana Roe, MD, 1 mg at 07/05/20 1540 .  pantoprazole (PROTONIX) EC tablet 40 mg, 40 mg, Oral,  Daily, Elgergawy, Dawood S, MD .  pravastatin (PRAVACHOL) tablet 20 mg, 20 mg, Oral, QHS, Elgergawy, Leana Roe, MD, 20 mg at 07/04/20 2108 .  senna-docusate (Senokot-S) tablet 1 tablet, 1 tablet, Oral, QHS PRN, Elgergawy, Leana Roe, MD   PHYSICAL EXAM SECTION: BP 112/87 (BP Location: Right Wrist)   Pulse 79   Temp 98.1 F (36.7 C) (Oral)   Resp 18   Ht 5\' 4"  (1.626 m)   Wt 90.7 kg   SpO2 94%   BMI 34.33 kg/m   Body mass index is 34.33 kg/m.   General appearance: Well-developed well-nourished no gross deformities  Eyes clear normal vision no evidence of conjunctivitis or jaundice, extraocular muscles intact  ENT: ears hearing normal, nasal passages clear, throat clear   Lymph nodes: No lymphadenopathy  Neck is supple without palpable mass, full range of motion  Cardiovascular normal pulse and perfusion in all 4 extremities normal color without edema  Neurologically deep tendon reflexes are equal and normal, no sensation loss or deficits no pathologic reflexes  Psychological: Awake alert and oriented x3 mood and affect normal  Skin no lacerations or ulcerations no nodularity no palpable masses, no erythema or nodularity  Musculoskeletal:  Left leg is tender at the hip  The skin is normal The leg is short snd external rotation Knee ankle stable Muscle tone normal  Right thumb splint  Other extremities normal    7:30 AM

## 2020-07-05 NOTE — TOC Initial Note (Signed)
Transition of Care [facility]) - Initial/Assessment Note    Patient Details  Name: Laura Bennett MRN: [id number] Date of Birth: 12-11-1940  Transition of Care [facility]) CM/SW Contact:    Leitha Bleak, RN Phone Number: 07/05/2020, 1:40 PM  Clinical Narrative:    Patient admitted with Left hip fracture. Patient is in surgery at present.  Per MD work up for SNF and call husband as they are new to the area. TOC spoke with Mariana Kaufman. He gave permission to send out to for bed offers. Wants a nice facility and okay with sending out the [place]. State they live between [place] and [place].  PT eval is pending. FL2 completed and sent initial referral out to multiple SNF's.  Patient is fully vaccinated.                Expected Discharge Plan: Skilled Nursing Facility Barriers to Discharge: Continued Medical Work up   Patient Goals and CMS Choice Patient states their goals for this hospitalization and ongoing recovery are:: to go to SNF. CMS Medicare.gov Compare Post Acute Care list provided to:: Patient Represenative (must comment) Choice offered to / list presented to : Spouse  Expected Discharge Plan and Services Expected Discharge Plan: Skilled Nursing Facility    Living arrangements for the past 2 months: Single Family Home                   Prior Living Arrangements/Services Living arrangements for the past 2 months: Single Family Home Lives with:: Spouse          Need for Family Participation in Patient Care: Yes (Comment) Care giver support system in place?: Yes (comment)   Criminal Activity/Legal Involvement Pertinent to Current Situation/Hospitalization: No - Comment as needed  Activities of Daily Living Home Assistive Devices/Equipment: Cane (specify quad or straight),Walker (specify type) ADL Screening (condition at time of admission) Patient's cognitive ability adequate to safely complete daily activities?: Yes Is the patient deaf or have difficulty hearing?: Yes Does the  patient have difficulty seeing, even when wearing glasses/contacts?: No Does the patient have difficulty concentrating, remembering, or making decisions?: No Patient able to express need for assistance with ADLs?: Yes Does the patient have difficulty dressing or bathing?: Yes Independently performs ADLs?: No Communication: Independent Dressing (OT): Needs assistance Is this a change from baseline?: Change from baseline, expected to last >3 days Grooming: Needs assistance Is this a change from baseline?: Change from baseline, expected to last >3 days Feeding: Independent Bathing: Needs assistance Is this a change from baseline?: Change from baseline, expected to last >3 days Toileting: Needs assistance Is this a change from baseline?: Change from baseline, expected to last >3days In/Out Bed: Needs assistance Is this a change from baseline?: Change from baseline, expected to last >3 days Walks in Home: Dependent Is this a change from baseline?: Change from baseline, expected to last >3 days Does the patient have difficulty walking or climbing stairs?: Yes Weakness of Legs: Both Weakness of Arms/Hands: None  Permission Sought/Granted      Share Information with NAME: marvin     Permission granted to share info w Relationship: husband    Emotional Assessment      Alcohol / Substance Use: Not Applicable Psych Involvement: No (comment)  Admission diagnosis:  Closed left hip fracture (HCC) [S72.002A] Displaced fracture of left femoral neck (HCC) [S72.002A] Patient Active Problem List   Diagnosis Date Noted  . Closed left hip fracture (HCC) 07/04/2020  . Essential hypertension 07/04/2020  . Prediabetes 07/04/2020  .  Depression 07/04/2020   PCP:  Trisha Mangle, FNP Pharmacy:   [facility] [phone] - [place], [place] - [address] [facility] [address] [place] [address] Phone: [phone] Fax:  [phone]   Readmission Risk Interventions Readmission Risk Prevention Plan 07/05/2020  Medication Screening Complete  Transportation Screening Complete

## 2020-07-06 DIAGNOSIS — I10 Essential (primary) hypertension: Secondary | ICD-10-CM

## 2020-07-06 LAB — CBC
HCT: 27.9 % — ABNORMAL LOW (ref 36.0–46.0)
Hemoglobin: 8.2 g/dL — ABNORMAL LOW (ref 12.0–15.0)
MCH: 25.5 pg — ABNORMAL LOW (ref 26.0–34.0)
MCHC: 29.4 g/dL — ABNORMAL LOW (ref 30.0–36.0)
MCV: 86.6 fL (ref 80.0–100.0)
Platelets: 202 10*3/uL (ref 150–400)
RBC: 3.22 MIL/uL — ABNORMAL LOW (ref 3.87–5.11)
RDW: 15.9 % — ABNORMAL HIGH (ref 11.5–15.5)
WBC: 8.9 10*3/uL (ref 4.0–10.5)
nRBC: 0 % (ref 0.0–0.2)

## 2020-07-06 LAB — GLUCOSE, CAPILLARY
Glucose-Capillary: 100 mg/dL — ABNORMAL HIGH (ref 70–99)
Glucose-Capillary: 94 mg/dL (ref 70–99)
Glucose-Capillary: 108 mg/dL — ABNORMAL HIGH (ref 70–99)
Glucose-Capillary: 95 mg/dL (ref 70–99)
Glucose-Capillary: 107 mg/dL — ABNORMAL HIGH (ref 70–99)

## 2020-07-06 LAB — BASIC METABOLIC PANEL
Anion gap: 7 (ref 5–15)
BUN: 10 mg/dL (ref 8–23)
CO2: 26 mmol/L (ref 22–32)
Calcium: 8 mg/dL — ABNORMAL LOW (ref 8.9–10.3)
Chloride: 102 mmol/L (ref 98–111)
Creatinine, Ser: 0.9 mg/dL (ref 0.44–1.00)
GFR, Estimated: >60 mL/min (ref >60)
Glucose, Bld: 100 mg/dL — ABNORMAL HIGH (ref 70–99)
Potassium: 4.4 mmol/L (ref 3.5–5.1)
Sodium: 135 mmol/L (ref 135–145)

## 2020-07-06 MED ORDER — NYSTATIN 100000 UNIT/GM EX POWD
CUTANEOUS | Status: DC | PRN
  Filled 2020-07-06: qty 15

## 2020-07-06 MED ORDER — SIMETHICONE 80 MG PO CHEW
80.0000 mg | CHEWABLE_TABLET | Freq: Four times a day (QID) | ORAL | Status: DC | PRN
  Administered 2020-07-06 (×2): 80 mg via ORAL
  Filled 2020-07-06 (×2): qty 1

## 2020-07-06 NOTE — TOC Progression Note (Signed)
Transition of Care [facility]) - Progression Note    Patient Details  Name: Laura Bennett MRN: [id number] Date of Birth: June 03, 1940  [facility]) CM/SW Contact  Barry Brunner, LCSW Phone Number: 07/06/2020, 4:03 PM  Clinical Narrative:    Patient's spouse requested additional SNF referrals sent to [facility]. CSW faxed out additional referrals. CSW informed patient's spouse if no additional bed offers are made prior to patient being medically stable for discharge, patient's remaining option will be to return home or accept [facility] bed offer. TOC to follow.   Expected Discharge Plan: Skilled Nursing Facility Barriers to Discharge: Continued Medical Work up  Expected Discharge Plan and Services Expected Discharge Plan: Skilled Nursing Facility       Living arrangements for the past 2 months: Single Family Home                                       Social Determinants of Health (SDOH) Interventions    Readmission Risk Interventions Readmission Risk Prevention Plan 07/05/2020  Medication Screening Complete  Transportation Screening Complete

## 2020-07-06 NOTE — Plan of Care (Signed)
  Problem: Acute Rehab PT Goals(only PT should resolve) Goal: Pt Will Go Supine/Side To Sit Outcome: Progressing Flowsheets (Taken 07/06/2020 1313) Pt will go Supine/Side to Sit: with minimal assist Goal: Patient Will Perform Sitting Balance Outcome: Progressing Flowsheets (Taken 07/06/2020 1313) Patient will perform sitting balance:  with supervision  with unilateral UE support  1-2 min Goal: Patient Will Transfer Sit To/From Stand Outcome: Progressing Flowsheets (Taken 07/06/2020 1313) Patient will transfer sit to/from stand: with minimal assist Goal: Pt Will Transfer Bed To Chair/Chair To Bed Outcome: Progressing Flowsheets (Taken 07/06/2020 1313) Pt will Transfer Bed to Chair/Chair to Bed: with min assist Goal: Pt Will Perform Standing Balance Or Pre-Gait Outcome: Progressing Flowsheets (Taken 07/06/2020 1313) Pt will perform standing balance or pre-gait:  with minimal assist  1-2 min  with bilateral UE support Goal: Pt Will Ambulate Outcome: Progressing Flowsheets (Taken 07/06/2020 1313) Pt will Ambulate:  50 feet  with minimal assist  with least restrictive assistive device   1:14 PM,07/06/20 Esmeralda Links, PT, DPT Physical Therapist at [facility]

## 2020-07-06 NOTE — Evaluation (Signed)
Physical Therapy Evaluation Patient Details Name: Laura Bennett MRN: [id number] DOB: [birth year] Today's Date: 07/06/2020   History of Present Illness  80 y.o. female, with past medical history of hyperlipidemia, anxiety, depression, prediabetes, CKD stage IIIa, recently moved from [place] in August 2022, and with history of elective right hip replacement due to osteoarthritis. L posterior THA after mechanical fall. Posterior hip precautions.  Clinical Impression  Present for PT eval s/p L THA posterior approach. MD in room to start. Reports no hip pain at start of PT, but increased as begin exercises and attempt transitions. Decreased AROM in LLE throughout secondary to surgery. Required ModA-MaxA to complete bed mobility and movement of LEs throughout. Cont difficulty with trunk strength with transfers despite HOB being elevated as they report having a hospital bed at home. Attempt sit to/from stand transfer x4, unable to complete secondary to decreased LE activation and pain. Patient reports increased fatigue and requested to transition to bed. RN notified of position and decreased mobility. Patient will benefit from continued physical therapy in hospital and recommended venue below to increase strength, balance, endurance for safe ADLs and gait.     Follow Up Recommendations SNF    Equipment Recommendations  None recommended by PT    Recommendations for Other Services       Precautions / Restrictions Precautions Precautions: Posterior Hip;Fall Restrictions Weight Bearing Restrictions: Yes LLE Weight Bearing: Weight bearing as tolerated Other Position/Activity Restrictions: posterior hip precautions      Mobility  Bed Mobility Overal bed mobility: Needs Assistance Bed Mobility: Supine to Sit;Sit to Supine     Supine to sit: Mod assist;HOB elevated Sit to supine: Mod assist;Max assist   General bed mobility comments: difficulty moving LEs. Patient Response:  Cooperative  Transfers                    Ambulation/Gait                Stairs            Wheelchair Mobility    Modified Rankin (Stroke Patients Only)       Balance Overall balance assessment: Needs assistance Sitting-balance support: Bilateral upper extremity supported;Feet supported Sitting balance-Leahy Scale: Fair Sitting balance - Comments: required B UE assist to sit                                     Pertinent Vitals/Pain Pain Assessment: 0-10 Pain Score: 2  Pain Location: stomach-gas Pain Descriptors / Indicators: Burning Pain Intervention(s): Monitored during session    Home Living Family/patient expects to be discharged to:: Private residence Living Arrangements: Spouse/significant other Available Help at Discharge: Family         Home Layout: Two level Home Equipment: Environmental consultant - 2 wheels;Cane - quad      Prior Function Level of Independence: Independent with assistive device(s)         Comments: quad cane for community ambulation     Hand Dominance        Extremity/Trunk Assessment   Upper Extremity Assessment Upper Extremity Assessment: Overall WFL for tasks assessed;Generalized weakness    Lower Extremity Assessment Lower Extremity Assessment: Generalized weakness    Cervical / Trunk Assessment Cervical / Trunk Assessment: Kyphotic  Communication   Communication: No difficulties  Cognition Arousal/Alertness: Awake/alert Behavior During Therapy: WFL for tasks assessed/performed Overall Cognitive Status: Within Functional Limits for tasks assessed  General Comments      Exercises General Exercises - Lower Extremity Ankle Circles/Pumps: AROM;Strengthening;Both;10 reps;Supine Quad Sets: AROM;Strengthening;Both;10 reps;Supine Gluteal Sets: AROM;Strengthening;Both;10 reps;Supine Hip ABduction/ADduction: AROM;Strengthening;Left;5  reps;Supine   Assessment/Plan    PT Assessment Patient needs continued PT services  PT Problem List Decreased strength;Decreased range of motion;Decreased knowledge of use of DME;Decreased activity tolerance;Decreased safety awareness;Decreased balance;Decreased knowledge of precautions;Decreased mobility;Decreased coordination       PT Treatment Interventions DME instruction;Balance training;Gait training;Neuromuscular re-education;Stair training;Functional mobility training;Patient/family education;Therapeutic activities;Therapeutic exercise    PT Goals (Current goals can be found in the Care Plan section)  Acute Rehab PT Goals Patient Stated Goal: get stronger PT Goal Formulation: With patient Time For Goal Achievement: 07/19/20 Potential to Achieve Goals: Good    Frequency 7X/week   Barriers to discharge        Co-evaluation               AM-PAC PT "6 Clicks" Mobility  Outcome Measure Help needed turning from your back to your side while in a flat bed without using bedrails?: A Little Help needed moving from lying on your back to sitting on the side of a flat bed without using bedrails?: A Lot Help needed moving to and from a bed to a chair (including a wheelchair)?: Total Help needed standing up from a chair using your arms (e.g., wheelchair or bedside chair)?: Total Help needed to walk in hospital room?: Total Help needed climbing 3-5 steps with a railing? : Total 6 Click Score: 9    End of Session Equipment Utilized During Treatment: Gait belt Activity Tolerance: Patient tolerated treatment well;Patient limited by fatigue;Patient limited by pain Patient left: in bed;with SCD's reapplied;with call bell/phone within reach Nurse Communication: Mobility status;Patient requests pain meds PT Visit Diagnosis: Unsteadiness on feet (R26.81);Other abnormalities of gait and mobility (R26.89);Repeated falls (R29.6);Muscle weakness (generalized) (M62.81)    Time:  8416-6063 PT Time Calculation (min) (ACUTE ONLY): 54 min   Charges:   PT Evaluation $PT Eval Low Complexity: 1 Low PT Treatments $Therapeutic Exercise: 23-37 mins $Self Care/Home Management: 8-22        1:12 PM,07/06/20 Esmeralda Links, PT, DPT Physical Therapist at [facility]   Valeta Harms 07/06/2020, 1:05 PM

## 2020-07-06 NOTE — Progress Notes (Signed)
Patient ID: Laura Bennett, female   DOB: [birth year], 80 y.o.   MRN: [id number]  POD # 1   LEFT BIPOLAR FOR LEFT HIP FRACTURE   DIRECT LATERAL APPROACH   BP (!) 113/54   Pulse 79   Temp 98.4 F (36.9 C)   Resp (!) 21   Ht 5\' 4"  (1.626 m)   Wt 90 kg   SpO2 95%   BMI 34.06 kg/m   CBC Latest Ref Rng & Units 07/06/2020 07/05/2020 07/04/2020  WBC 4.0 - 10.5 K/uL 8.9 8.0 7.6  Hemoglobin 12.0 - 15.0 g/dL 8.2(L) 10.8(L) 10.8(L)  Hematocrit 36.0 - 46.0 % 27.9(L) 36.2 34.8(L)  Platelets 150 - 400 K/uL 202 243 286    BMP Latest Ref Rng & Units 07/06/2020 07/05/2020 07/04/2020  Glucose 70 - 99 mg/dL 07/06/2020) 570(V) 82  BUN 8 - 23 mg/dL 10 14 13   Creatinine 0.44 - 1.00 mg/dL 779(T ) 9.03  Sodium 135 - 145 mmol/L 135 134(L) 133(L)  Potassium 3.5 - 5.1 mmol/L 4.4 4.5 4.8  Chloride 98 - 111 mmol/L 102 100 102  CO2 22 - 32 mmol/L 26 23 22   Calcium 8.9 - 10.3 mg/dL 8.0(L) 8.9 9.1    HOLD ON TRANSFUSION SEE IF ANY HYPOXIA OR ORTHOSTASIS   PT   REHAB IF STABLE TOMORROW

## 2020-07-06 NOTE — Progress Notes (Signed)
Pt has called multiple times for repositioning. Pain medication and ice pack were given (see MAR). Bed in low position, call bell in place.

## 2020-07-06 NOTE — Progress Notes (Signed)
Pt called out and stated she needed to call her husband. Pt stated  " I heard my husband in here"  Pt responded appropriately to orientation questions. Pt requested to be repositioned and stated she needed her right leg straightened. Bed in low position and call bell in reach. Will reassess

## 2020-07-06 NOTE — Progress Notes (Signed)
Pt stated she was not having any pain and did not want the scheduled pain medication. (See MAR). Will reassess for pain.

## 2020-07-06 NOTE — Progress Notes (Signed)
Patient Demographics:    Laura Bennett, is a 80 y.o. female, DOB - [birth year], BMW:[id number]  Admit date - 07/04/2020   Admitting Physician Starleen Arms, MD  Outpatient Primary MD for the patient is Trisha Mangle, FNP  LOS - 2   Chief Complaint  Patient presents with  . Fall  . Hip Pain        Subjective:    Laura Bennett was seen and examined today.  Orthopedic surgeon and husband present at bedside Patient has no complaints Postop day #1-tolerated procedure well   Patient and husband willing to go to SNF requesting SNF to be close to home with insurance approval     Assessment  & Plan :    Active Problems:   Closed left hip fracture [facility])   Essential hypertension   Prediabetes   Depression  Brief Summary:- 80 y.o. female, with past medical history of hyperlipidemia, anxiety, depression, prediabetes, CKD stage IIIa, recently moved from [place] in August 2022, and with history of elective right hip replacement due to osteoarthritis  -------------------------------------------------------------------------------------------------------------------------------------------------------------  Assessment and plan:  1) left hip fracture- -postop day #1 -status post ORIF by Dr. Romeo Apple on 07/05/2020 -Status post mechanical fall  -Orthopedic surgeon recommends--weightbearing as tolerated Aspirin for 30 days for DVT prevention Staples out at 2 weeks Office visit at 4 weeks Direct lateral hip approach precautions -Management as per orthopedic team  -Pending PT evaluation today, insurance approval for SNF -Mild drop in H&H noted: 10.8, 8.2-no signs of bleeding -monitor hemoglobin -if below 7 or symptomatic will transfuse  2)Left lung base nodule -This is incidental finding on chest x-ray, will need CT chest on nonemergent patient, can be done as an outpatient,  --Patient  and husband aware -Patient recently moved from [place], discussed with husband, he will try to check with her PCP in [place] if she had any previous chest x-ray in the past with similar findings. -She denies any history of smoking. -Currently stable  3)Anxiety/depression -Continue Cymbalta  4)Hyperlipidemia -Resuming pravastatin  5)Pre Diabetes -Husband reports most recent A1c is 6.1,  Use Novolog/Humalog Sliding scale insulin with Accu-Cheks/Fingersticks as ordered   6)GERD -Continue with Protonix  7)Stage IIIa CKD -At baseline, continue to monitor.  8)Thyroid disease -Continue with levothyroxine 137 mcg    Status is: Inpatient  Remains inpatient appropriate because:Please see above   Disposition: The patient is from: Home              Anticipated d/c is to: SNF              Anticipated d/c date is: 2 days              Patient currently is not medically stable to d/c. status post hip surgery requiring pain control and most likely SNF placement -pending PT evaluation and insurance approval for SNF    Code Status :  -  Code Status: Full Code  Family Communication:   (patient is alert, awake and coherent)  Discussed with husband at bedside Consults  :  ortho  DVT Prophylaxis  :   - SCDs   SCDs Start: 07/05/20 1758 Place TED hose Start: 07/05/20 1758 SCDs Start: 07/04/20 1843    Lab Results  Component Value  Date   PLT 202 07/06/2020    Inpatient Medications  Scheduled Meds: . sodium chloride   Intravenous Once  . amitriptyline  10 mg Oral QHS  . aspirin EC  325 mg Oral Q breakfast  . B-complex with vitamin C  1 tablet Oral Daily  . Chlorhexidine Gluconate Cloth  6 each Topical Daily  . cholecalciferol  2,000 Units Oral Daily  . docusate sodium  100 mg Oral BID  . DULoxetine  60 mg Oral Daily  . feeding supplement  237 mL Oral BID BM  . gabapentin  200 mg Oral Daily  . insulin aspart  0-5 Units Subcutaneous QHS  . insulin aspart  0-6 Units  Subcutaneous TID WC  . levothyroxine  137 mcg Oral Q0600  . lidocaine-EPINEPHrine  10 mL Other Once  . lidocaine-EPINEPHrine  10 mL Other Once  . pantoprazole  40 mg Oral Daily  . pravastatin  20 mg Oral QHS  . traMADol  50 mg Oral Q6H   Continuous Infusions: . acetaminophen 1,000 mg (07/06/20 0508)  . methocarbamol (ROBAXIN) IV 500 mg (07/05/20 0212)   PRN Meds:.alum & mag hydroxide-simeth, bisacodyl, HYDROcodone-acetaminophen, labetalol, menthol-cetylpyridinium **OR** phenol, methocarbamol **OR** methocarbamol (ROBAXIN) IV, metoCLOPramide **OR** metoCLOPramide (REGLAN) injection, morphine injection, ondansetron **OR** ondansetron (ZOFRAN) IV, senna-docusate, simethicone    Anti-infectives (From admission, onward)   Start     Dose/Rate Route Frequency Ordered Stop   07/05/20 1845  ceFAZolin (ANCEF) IVPB 2g/100 mL premix        2 g 200 mL/hr over 30 Minutes Intravenous Every 6 hours 07/05/20 1758 07/06/20 0031   07/05/20 1107  ceFAZolin (ANCEF) 2-4 GM/100ML-% IVPB       Note to Pharmacy: Waynard EdwardsMorris, Barbara   : cabinet override      07/05/20 1107 07/05/20 1843   07/05/20 0930  ceFAZolin (ANCEF) IVPB 2g/100 mL premix        2 g 200 mL/hr over 30 Minutes Intravenous On call to O.R. 07/05/20 0842 07/05/20 1220        Objective:   Vitals:   07/05/20 1620 07/05/20 2129 07/06/20 0606 07/06/20 1000  BP: (!) 86/46 (!) 124/52 (!) 104/57 (!) 113/54  Pulse: 90 86 88 79  Resp: 14 (!) 21    Temp: 98.3 F (36.8 C) 98.3 F (36.8 C) 98.4 F (36.9 C)   TempSrc:  Oral    SpO2: 90% 96% 95%   Weight:      Height:        Wt Readings from Last 3 Encounters:  07/05/20 90 kg     Intake/Output Summary (Last 24 hours) at 07/06/2020 1227 Last data filed at 07/06/2020 0900 Gross per 24 hour  Intake 2663.91 ml  Output 1475 ml  Net 1188.91 ml      Physical Exam:   General:  Alert, oriented, cooperative, no distress;   HEENT:  Normocephalic, PERRL, otherwise with in Normal limits    Neuro:  CNII-XII intact. , normal motor and sensation, reflexes intact   Lungs:   Clear to auscultation BL, Respirations unlabored, no wheezes / crackles  Cardio:    S1/S2, RRR, No murmure, No Rubs or Gallops   Abdomen:   Soft, non-tender, bowel sounds active all four quadrants,  no guarding or peritoneal signs.  Muscular skeletal:   Bilateral AKA, Limited exam - in bed, able to move all 2 upper extremities, Normal strength, left hip limited range of motion due to discomfort postop 2+ pulses,  symmetric, No pitting edema  Skin:  Dry, warm to touch, negative for any Rashes, surgical wound left hip negative erythema edema  Wounds: Please see nursing documentation -postsurgical wound left hip           Data Review:   Micro Results Recent Results (from the past 240 hour(s))  Resp Panel by RT-PCR (Flu A&B, Covid) Nasopharyngeal Swab     Status: None   Collection Time: 07/04/20  4:49 PM   Specimen: Nasopharyngeal Swab; Nasopharyngeal(NP) swabs in vial transport medium  Result Value Ref Range Status   SARS Coronavirus 2 by RT PCR NEGATIVE NEGATIVE Final    Comment: (NOTE) SARS-CoV-2 target nucleic acids are NOT DETECTED.  The SARS-CoV-2 RNA is generally detectable in upper respiratory specimens during the acute phase of infection. The lowest concentration of SARS-CoV-2 viral copies this assay can detect is 138 copies/mL. A negative result does not preclude SARS-Cov-2 infection and should not be used as the sole basis for treatment or other patient management decisions. A negative result may occur with  improper specimen collection/handling, submission of specimen other than nasopharyngeal swab, presence of viral mutation(s) within the areas targeted by this assay, and inadequate number of viral copies(<138 copies/mL). A negative result must be combined with clinical observations, patient history, and epidemiological information. The expected result is Negative.  Fact Sheet for  Patients:  BloggerCourse.com  Fact Sheet for Healthcare Providers:  SeriousBroker.it  This test is no t yet approved or cleared by the [place] FDA and  has been authorized for detection and/or diagnosis of SARS-CoV-2 by FDA under an Emergency Use Authorization (EUA). This EUA will remain  in effect (meaning this test can be used) for the duration of the COVID-19 declaration under Section 564(b)(1) of the Act, 21 U.S.C.section 360bbb-3(b)(1), unless the authorization is terminated  or revoked sooner.       Influenza A by PCR NEGATIVE NEGATIVE Final   Influenza B by PCR NEGATIVE NEGATIVE Final    Comment: (NOTE) The Xpert Xpress SARS-CoV-2/FLU/RSV plus assay is intended as an aid in the diagnosis of influenza from Nasopharyngeal swab specimens and should not be used as a sole basis for treatment. Nasal washings and aspirates are unacceptable for Xpert Xpress SARS-CoV-2/FLU/RSV testing.  Fact Sheet for Patients: BloggerCourse.com  Fact Sheet for Healthcare Providers: SeriousBroker.it  This test is not yet approved or cleared by the [place] FDA and has been authorized for detection and/or diagnosis of SARS-CoV-2 by FDA under an Emergency Use Authorization (EUA). This EUA will remain in effect (meaning this test can be used) for the duration of the COVID-19 declaration under Section 564(b)(1) of the Act, 21 U.S.C. section 360bbb-3(b)(1), unless the authorization is terminated or revoked.  Performed at [facility], [address], [place], [place] [address]   Surgical PCR screen     Status: None   Collection Time: 07/04/20  9:10 PM   Specimen: Nasal Mucosa; Nasal Swab  Result Value Ref Range Status   MRSA, PCR NEGATIVE NEGATIVE Final   Staphylococcus aureus NEGATIVE NEGATIVE Final    Comment: (NOTE) The Xpert SA Assay (FDA approved for NASAL specimens in patients  98 years of age and older), is one component of a comprehensive surveillance program. It is not intended to diagnose infection nor to guide or monitor treatment. Performed at [facility], [address], [place], [place] [address]     Radiology Reports DG Chest 2 View  Result Date: 07/04/2020 CLINICAL DATA:  Status post fall. EXAM: CHEST - 2 VIEW COMPARISON:  None. FINDINGS: Hazy opacification of the mid and lower right lung is seen. An overlying, partially calcified right breast implant is also noted. A left breast implant is seen lateral to the left hemithorax. A 1.2 cm x 0.8 cm focal opacity is seen overlying the lateral aspect of the left lung base. No pneumothorax is identified. The heart size and mediastinal contours are within normal limits. There is tortuosity of the descending thoracic aorta. Degenerative changes seen throughout the thoracic spine. IMPRESSION: 1. Partially calcified right breast implant. Mild airspace disease within the mid to lower right lung cannot be excluded. 2. Focal opacity overlying the left lung base which may represent an underlying lung nodule. Correlation with nonemergent chest CT is recommended. Electronically Signed   By: Aram Candela M.D.   On: 07/04/2020 15:29   DG Pelvis Portable  Result Date: 07/05/2020 CLINICAL DATA:  Left hip replacement. EXAM: PORTABLE PELVIS 1-2 VIEWS COMPARISON:  Left hip x-rays from yesterday. FINDINGS: The left hip demonstrates a hemiarthroplasty without evidence of hardware failure or complication. There is no fracture or dislocation. The alignment is anatomic. Post-surgical changes noted in the surrounding soft tissues. Prior right total hip arthroplasty. IMPRESSION: 1. Interval left hip hemiarthroplasty without acute postoperative complication. Electronically Signed   By: Obie Dredge M.D.   On: 07/05/2020 15:47   DG Hip Unilat W or Wo Pelvis 2-3 Views Left  Result Date: 07/04/2020 CLINICAL DATA:  Status post fall. EXAM:  DG HIP (WITH OR WITHOUT PELVIS) 2-3V LEFT COMPARISON:  None. FINDINGS: A total right hip replacement is seen. There is no evidence of surrounding lucency to suggest the presence of hardware loosening or infection. Acute fracture deformity is seen extending through the neck of the proximal left femur. Approximately 1/2 shaft width dorsal displacement of the distal fracture site is noted. There is no evidence of dislocation. IMPRESSION: Acute fracture of the proximal left femur. Electronically Signed   By: Aram Candela M.D.   On: 07/04/2020 15:25     CBC Recent Labs  Lab 07/04/20 1414 07/05/20 0620 07/06/20 0522  WBC 7.6 8.0 8.9  HGB 10.8* 10.8* 8.2*  HCT 34.8* 36.2 27.9*  PLT 286 243 202  MCV 83.7 85.8 86.6  MCH 26.0 25.6* 25.5*  MCHC 31.0 29.8* 29.4*  RDW 15.8* 16.0* 15.9*  LYMPHSABS 1.6  --   --   MONOABS 1.0  --   --   EOSABS 0.1  --   --   BASOSABS 0.1  --   --     Chemistries  Recent Labs  Lab 07/04/20 1414 07/05/20 0620 07/06/20 0522  NA 133* 134* 135  K 4.8 4.5 4.4  CL 102 100 102  CO2 22 23 26   GLUCOSE 82 101* 100*  BUN 13 14 10   CREATININE 0.95 1.03* 0.90  CALCIUM 9.1 8.9 8.0*   ------------------------------------------------------------------------------------------------------------------ No results for input(s): CHOL, HDL, LDLCALC, TRIG, CHOLHDL, LDLDIRECT in the last 72 hours.  Lab Results  Component Value Date   HGBA1C 6.1 (H) 07/04/2020   SIGNED: , MD, FHM. [facility],  Pager (please use [email or web address] to page/text)  Please use Epic Secure Chat for non-urgent communication (7AM-7PM) If 7PM-7AM, please contact night-coverage [email or web address],  07/06/2020, 12:28 PM

## 2020-07-07 ENCOUNTER — Inpatient Hospital Stay (HOSPITAL_COMMUNITY): Payer: Medicare Other

## 2020-07-07 LAB — CBC
HCT: 28.3 % — ABNORMAL LOW (ref 36.0–46.0)
Hemoglobin: 8.4 g/dL — ABNORMAL LOW (ref 12.0–15.0)
MCH: 25.8 pg — ABNORMAL LOW (ref 26.0–34.0)
MCHC: 29.7 g/dL — ABNORMAL LOW (ref 30.0–36.0)
MCV: 86.8 fL (ref 80.0–100.0)
Platelets: 220 10*3/uL (ref 150–400)
RBC: 3.26 MIL/uL — ABNORMAL LOW (ref 3.87–5.11)
RDW: 16.4 % — ABNORMAL HIGH (ref 11.5–15.5)
WBC: 9.8 10*3/uL (ref 4.0–10.5)
nRBC: 0 % (ref 0.0–0.2)

## 2020-07-07 LAB — BLOOD GAS, ARTERIAL
Acid-Base Excess: 4.1 mmol/L — ABNORMAL HIGH (ref 0.0–2.0)
Bicarbonate: 27.7 mmol/L (ref 20.0–28.0)
FIO2: 32
O2 Saturation: 93.6 %
Patient temperature: 37.3
pCO2 arterial: 51.8 mmHg — ABNORMAL HIGH (ref 32.0–48.0)
pH, Arterial: 7.369 (ref 7.350–7.450)
pO2, Arterial: 71.4 mmHg — ABNORMAL LOW (ref 83.0–108.0)

## 2020-07-07 LAB — GLUCOSE, CAPILLARY
Glucose-Capillary: 112 mg/dL — ABNORMAL HIGH (ref 70–99)
Glucose-Capillary: 108 mg/dL — ABNORMAL HIGH (ref 70–99)
Glucose-Capillary: 101 mg/dL — ABNORMAL HIGH (ref 70–99)
Glucose-Capillary: 111 mg/dL — ABNORMAL HIGH (ref 70–99)
Glucose-Capillary: 108 mg/dL — ABNORMAL HIGH (ref 70–99)

## 2020-07-07 LAB — SARS CORONAVIRUS 2 (TAT 6-24 HRS): SARS Coronavirus 2: NEGATIVE

## 2020-07-07 MED ORDER — HYDROCODONE-ACETAMINOPHEN 5-325 MG PO TABS: 1.0000 | ORAL_TABLET | Freq: Four times a day (QID) | ORAL | 0 refills | Status: AC | PRN

## 2020-07-07 MED ORDER — MENTHOL 3 MG MT LOZG: 1.0000 | LOZENGE | OROMUCOSAL | 12 refills | Status: DC | PRN

## 2020-07-07 MED ORDER — ENOXAPARIN SODIUM 40 MG/0.4ML ~~LOC~~ SOLN
40.0000 mg | SUBCUTANEOUS | Status: DC
  Administered 2020-07-07: 40 mg via SUBCUTANEOUS
  Filled 2020-07-07: qty 0.4

## 2020-07-07 MED ORDER — ASPIRIN 325 MG PO TBEC: 325.0000 mg | DELAYED_RELEASE_TABLET | Freq: Every day | ORAL | 0 refills | Status: DC

## 2020-07-07 MED ORDER — ONDANSETRON HCL 4 MG PO TABS: 4.0000 mg | ORAL_TABLET | Freq: Four times a day (QID) | ORAL | 0 refills | Status: DC | PRN

## 2020-07-07 MED ORDER — FUROSEMIDE 10 MG/ML IJ SOLN
40.0000 mg | Freq: Once | INTRAMUSCULAR | Status: AC
  Administered 2020-07-07: 40 mg via INTRAVENOUS
  Filled 2020-07-07: qty 4

## 2020-07-07 MED ORDER — SIMETHICONE 80 MG PO CHEW: 80.0000 mg | CHEWABLE_TABLET | Freq: Four times a day (QID) | ORAL | 0 refills | Status: DC | PRN

## 2020-07-07 MED ORDER — SENNOSIDES-DOCUSATE SODIUM 8.6-50 MG PO TABS: 1.0000 | ORAL_TABLET | Freq: Every evening | ORAL | 0 refills | Status: DC | PRN

## 2020-07-07 MED ORDER — DOCUSATE SODIUM 100 MG PO CAPS: 100.0000 mg | ORAL_CAPSULE | Freq: Two times a day (BID) | ORAL | 0 refills | Status: DC

## 2020-07-07 MED ORDER — NYSTATIN 100000 UNIT/GM EX POWD: CUTANEOUS | 0 refills | Status: DC | PRN

## 2020-07-07 MED ORDER — MORPHINE SULFATE (PF) 2 MG/ML IV SOLN: 1.0000 mg | INTRAVENOUS | Status: DC | PRN

## 2020-07-07 MED ORDER — PHENOL 1.4 % MT LIQD: 1.0000 | OROMUCOSAL | 0 refills | Status: DC | PRN

## 2020-07-07 MED ORDER — METHOCARBAMOL 500 MG PO TABS: 500.0000 mg | ORAL_TABLET | Freq: Four times a day (QID) | ORAL | 0 refills | Status: AC | PRN

## 2020-07-07 NOTE — Discharge Summary (Addendum)
Physician Discharge Summary [facility]    Patient: Laura Bennett                   Admit date: 07/04/2020   DOB: 1940/12/05             Discharge date:07/07/2020/10:10 AM [id number]                          PCP: Trisha Mangle, FNP  Disposition:  SNF  Recommendations for Outpatient Follow-up:   . Follow up: in 2 week with ortho team  .   Discharge Condition: Stable   Code Status:   Code Status: Full Code  Diet recommendation: Regular healthy diet   The patient was seen and examined, stable required 2 L of oxygen, currently satting 97%. Chest x-ray was clear, with minimal atelectasis, ABG within normal limits-consistent with mild hypercarbia Patient is shallow breathing -encouraged deep breathing, using incentive spirometer and flutter valve One-time Lasix was given.  Patient stable for discharge to SNF today-pending insurance approval.    Discharge Diagnoses:    Active Problems:   Closed left hip fracture [facility])   Essential hypertension   Prediabetes   Depression   History of Present Illness/ Hospital Course [name] Summary:   80 y.o.female,with past medical history of hyperlipidemia, anxiety, depression, prediabetes, CKD stage IIIa, recently moved from [place] in August 2022, and with history of elective right hip replacement due to osteoarthritis   Left hip fracture- -postop day #2  -status post ORIF by Dr. Romeo Apple on 07/05/2020 -Status post mechanical fall  -Orthopedic surgeon recommends--weightbearing as tolerated Aspirin 325 PO daily -for 30 days for DVT prevention Staples out at 2 weeks Office visit at 4 weeks Direct lateral hip approach precautions -Management as per orthopedic team  -Pending PT evaluation today, insurance approval for SNF -Mild drop in H&H noted: 10.8, 8.2-no signs of bleeding -monitor hemoglobin -if below 7 or symptomatic will transfuse  Left lung base nodule -This is incidental finding on chest x-ray,  will need CT chest on nonemergent patient, can be done as an outpatient,  --Patient and husband aware -Patient recently moved from [place], discussed with husband, he will try to check with her PCP in [place] if she had any previous chest x-ray in the past with similar findings. -She denies any history of smoking. -Currently stable  Anxiety/depression -Continue Cymbalta  Hyperlipidemia -Resuming pravastatin  PreDiabetes -Husband reports most recent A1c is 6.1, -last CBG 95, 107, 112, 108 -Please use Novolog/Humalog Sliding scale insulin with Accu-Cheks/Fingersticks - F/up with PCP regarding medication if needed in 1-2 wks    GERD -Continue with Protonix  Stage IIIa CKD -At baseline, continue to monitor.  Thyroid disease -Continue with levothyroxine 137 mcg   Disposition: The patient is from: Home  Anticipated d/c is to: SNF   Code Status :  -  Code Status: Full Code  Family Communication: Discussed with husband at bedside Consults  :  ortho  Nutritional status:  Nutrition Problem: Increased nutrient needs Etiology: post-op healing Signs/Symptoms: estimated needs Interventions: Ensure Enlive (each supplement provides 350kcal and 20 grams of protein),MVI  The patient's BMI is: Body mass index is 34.06 kg/m.  DVT Prophylaxis  :   -Aspirin 325 mg p.o. daily per orthopedic recommendation Small bowel SCDs   SCDs Start: 07/05/20 1758 Place TED hose Start: 07/05/20 1758 SCDs Start: 07/04/20 1843  Discharge Instructions:   Discharge Instructions    Activity as tolerated -  No restrictions   Complete by: As directed    Call MD for:  difficulty breathing, headache or visual disturbances   Complete by: As directed    Call MD for:  redness, tenderness, or signs of infection (pain, swelling, redness, odor or green/yellow discharge around incision site)   Complete by: As directed    Diet - low sodium heart healthy   Complete by: As  directed    Discharge instructions   Complete by: As directed    Continue PT OT per instructions, fall precautions DVT prophylaxis per Ortho recommendation: Continue aspirin 325 mg p.o. daily continue knee-high stockings, compression devices while in bed and sitting down   Discharge wound care:   Complete by: As directed    Per surgery/orthopedic recommendations, follow-up with orthopedic team   Discharge wound care:   Complete by: As directed    As directed by surgery   Increase activity slowly   Complete by: As directed        Medication List    STOP taking these medications   Black Cohosh 540 MG Caps     TAKE these medications   amitriptyline 10 MG tablet Commonly known as: ELAVIL Take 10 mg by mouth at bedtime.   aspirin 325 MG EC tablet Take 1 tablet (325 mg total) by mouth daily with breakfast.   Cholecalciferol 25 MCG (1000 UT) tablet Take 2,000 Units by mouth daily.   diclofenac 75 MG EC tablet Commonly known as: VOLTAREN Take 75 mg by mouth 2 (two) times daily.   docusate sodium 100 MG capsule Commonly known as: COLACE Take 1 capsule (100 mg total) by mouth 2 (two) times daily.   DULoxetine 30 MG capsule Commonly known as: CYMBALTA Take 60 mg by mouth daily.   estradiol 0.1 MG/GM vaginal cream Commonly known as: ESTRACE Place 0.5 g vaginally daily.   gabapentin 100 MG capsule Commonly known as: NEURONTIN Take 200 mg by mouth daily.   HYDROcodone-acetaminophen 5-325 MG tablet Commonly known as: NORCO/VICODIN Take 1-2 tablets by mouth every 6 (six) hours as needed for up to 3 days for moderate pain.   levothyroxine 137 MCG tablet Commonly known as: SYNTHROID Take 137 mcg by mouth every morning.   menthol-cetylpyridinium 3 MG lozenge Commonly known as: CEPACOL Take 1 lozenge (3 mg total) by mouth as needed for sore throat.   methocarbamol 500 MG tablet Commonly known as: ROBAXIN Take 1 tablet (500 mg total) by mouth every 6 (six) hours as  needed for up to 10 days for muscle spasms.   nystatin powder Commonly known as: MYCOSTATIN/NYSTOP Apply topically as needed (moisture).   omeprazole 20 MG capsule Commonly known as: PRILOSEC Take 20 mg by mouth daily.   ondansetron 4 MG tablet Commonly known as: ZOFRAN Take 1 tablet (4 mg total) by mouth every 6 (six) hours as needed for nausea.   oxybutynin 15 MG 24 hr tablet Commonly known as: DITROPAN XL Take 15 mg by mouth daily.   phenol 1.4 % Liqd Commonly known as: CHLORASEPTIC Use as directed 1 spray in the mouth or throat as needed for throat irritation / pain.   pravastatin 20 MG tablet Commonly known as: PRAVACHOL Take 20 mg by mouth at bedtime.   senna-docusate 8.6-50 MG tablet Commonly known as: Senokot-S Take 1 tablet by mouth at bedtime as needed for mild constipation.   simethicone 80 MG chewable tablet Commonly known as: MYLICON Chew 1 tablet (80 mg total) by mouth 4 (four) times daily  as needed for flatulence.   Vitamin B Complex Tabs Take 1 tablet by mouth daily.       No Known Allergies   Procedures /Studies:   DG Chest 2 View  Result Date: 07/04/2020 CLINICAL DATA:  Status post fall. EXAM: CHEST - 2 VIEW COMPARISON:  None. FINDINGS: Hazy opacification of the mid and lower right lung is seen. An overlying, partially calcified right breast implant is also noted. A left breast implant is seen lateral to the left hemithorax. A 1.2 cm x 0.8 cm focal opacity is seen overlying the lateral aspect of the left lung base. No pneumothorax is identified. The heart size and mediastinal contours are within normal limits. There is tortuosity of the descending thoracic aorta. Degenerative changes seen throughout the thoracic spine. IMPRESSION: 1. Partially calcified right breast implant. Mild airspace disease within the mid to lower right lung cannot be excluded. 2. Focal opacity overlying the left lung base which may represent an underlying lung nodule.  Correlation with nonemergent chest CT is recommended. Electronically Signed   By: Aram Candela M.D.   On: 07/04/2020 15:29   DG Pelvis Portable  Result Date: 07/05/2020 CLINICAL DATA:  Left hip replacement. EXAM: PORTABLE PELVIS 1-2 VIEWS COMPARISON:  Left hip x-rays from yesterday. FINDINGS: The left hip demonstrates a hemiarthroplasty without evidence of hardware failure or complication. There is no fracture or dislocation. The alignment is anatomic. Post-surgical changes noted in the surrounding soft tissues. Prior right total hip arthroplasty. IMPRESSION: 1. Interval left hip hemiarthroplasty without acute postoperative complication. Electronically Signed   By: Obie Dredge M.D.   On: 07/05/2020 15:47   DG Hip Unilat W or Wo Pelvis 2-3 Views Left  Result Date: 07/04/2020 CLINICAL DATA:  Status post fall. EXAM: DG HIP (WITH OR WITHOUT PELVIS) 2-3V LEFT COMPARISON:  None. FINDINGS: A total right hip replacement is seen. There is no evidence of surrounding lucency to suggest the presence of hardware loosening or infection. Acute fracture deformity is seen extending through the neck of the proximal left femur. Approximately 1/2 shaft width dorsal displacement of the distal fracture site is noted. There is no evidence of dislocation. IMPRESSION: Acute fracture of the proximal left femur. Electronically Signed   By: Aram Candela M.D.   On: 07/04/2020 15:25     Subjective:   Patient was seen and examined 07/07/2020, 10:10 AM Patient stable today. No acute distress.  No issues overnight Stable for discharge.  Discharge Exam:    Vitals:   07/06/20 1000 07/06/20 1435 07/06/20 2005 07/07/20 0504  BP: (!) 113/54 (!) 109/52 (!) 106/58 (!) 106/49  Pulse: 79 93 93 95  Resp:   18 20  Temp:  98.3 F (36.8 C) 98.3 F (36.8 C) 99.2 F (37.3 C)  TempSrc:  Oral  Oral  SpO2:  90% 91% (!) 75%  Weight:      Height:        General: Pt lying comfortably in bed & appears in no obvious  distress. Cardiovascular: S1 & S2 heard, RRR, S1/S2 +. No murmurs, rubs, gallops or clicks. No JVD or pedal edema. Respiratory: Clear to auscultation without wheezing, rhonchi or crackles. No increased work of breathing. Abdominal:  Non-distended, non-tender & soft. No organomegaly or masses appreciated. Normal bowel sounds heard. CNS: Alert and oriented. No focal deficits. Extremities: no edema, no cyanosis      The results of significant diagnostics from this hospitalization (including imaging, microbiology, ancillary and laboratory) are listed below for reference.  Microbiology:   Recent Results (from the past 240 hour(s))  Resp Panel by RT-PCR (Flu A&B, Covid) Nasopharyngeal Swab     Status: None   Collection Time: 07/04/20  4:49 PM   Specimen: Nasopharyngeal Swab; Nasopharyngeal(NP) swabs in vial transport medium  Result Value Ref Range Status   SARS Coronavirus 2 by RT PCR NEGATIVE NEGATIVE Final    Comment: (NOTE) SARS-CoV-2 target nucleic acids are NOT DETECTED.  The SARS-CoV-2 RNA is generally detectable in upper respiratory specimens during the acute phase of infection. The lowest concentration of SARS-CoV-2 viral copies this assay can detect is 138 copies/mL. A negative result does not preclude SARS-Cov-2 infection and should not be used as the sole basis for treatment or other patient management decisions. A negative result may occur with  improper specimen collection/handling, submission of specimen other than nasopharyngeal swab, presence of viral mutation(s) within the areas targeted by this assay, and inadequate number of viral copies(<138 copies/mL). A negative result must be combined with clinical observations, patient history, and epidemiological information. The expected result is Negative.  Fact Sheet for Patients:  BloggerCourse.com  Fact Sheet for Healthcare Providers:  SeriousBroker.it  This test  is no t yet approved or cleared by the [place] FDA and  has been authorized for detection and/or diagnosis of SARS-CoV-2 by FDA under an Emergency Use Authorization (EUA). This EUA will remain  in effect (meaning this test can be used) for the duration of the COVID-19 declaration under Section 564(b)(1) of the Act, 21 U.S.C.section 360bbb-3(b)(1), unless the authorization is terminated  or revoked sooner.       Influenza A by PCR NEGATIVE NEGATIVE Final   Influenza B by PCR NEGATIVE NEGATIVE Final    Comment: (NOTE) The Xpert Xpress SARS-CoV-2/FLU/RSV plus assay is intended as an aid in the diagnosis of influenza from Nasopharyngeal swab specimens and should not be used as a sole basis for treatment. Nasal washings and aspirates are unacceptable for Xpert Xpress SARS-CoV-2/FLU/RSV testing.  Fact Sheet for Patients: BloggerCourse.com  Fact Sheet for Healthcare Providers: SeriousBroker.it  This test is not yet approved or cleared by the [place] FDA and has been authorized for detection and/or diagnosis of SARS-CoV-2 by FDA under an Emergency Use Authorization (EUA). This EUA will remain in effect (meaning this test can be used) for the duration of the COVID-19 declaration under Section 564(b)(1) of the Act, 21 U.S.C. section 360bbb-3(b)(1), unless the authorization is terminated or revoked.  Performed at [facility], [address], [place], [place] [address]   Surgical PCR screen     Status: None   Collection Time: 07/04/20  9:10 PM   Specimen: Nasal Mucosa; Nasal Swab  Result Value Ref Range Status   MRSA, PCR NEGATIVE NEGATIVE Final   Staphylococcus aureus NEGATIVE NEGATIVE Final    Comment: (NOTE) The Xpert SA Assay (FDA approved for NASAL specimens in patients 26 years of age and older), is one component of a comprehensive surveillance program. It is not intended to diagnose infection nor to guide or monitor  treatment. Performed at [facility], [address] [place], [place] [address]      Labs:   CBC: Recent Labs  Lab 07/04/20 1414 07/05/20 0620 07/06/20 0522 07/07/20 0436  WBC 7.6 8.0 8.9 9.8  NEUTROABS 4.7  --   --   --   HGB 10.8* 10.8* 8.2* 8.4*  HCT 34.8* 36.2 27.9* 28.3*  MCV 83.7 85.8 86.6 86.8  PLT 286 243 202 220   Basic  Metabolic Panel: Recent Labs  Lab 07/04/20 1414 07/05/20 0620 07/06/20 0522  NA 133* 134* 135  K 4.8 4.5 4.4  CL 102 100 102  CO2 22 23 26   GLUCOSE 82 101* 100*  BUN 13 14 10   CREATININE 0.95 1.03* 0.90  CALCIUM 9.1 8.9 8.0*   Liver Function Tests: No results for input(s): AST, ALT, ALKPHOS, BILITOT, PROT, ALBUMIN in the last 168 hours. BNP (last 3 results) No results for input(s): BNP in the last 8760 hours. Cardiac Enzymes: No results for input(s): CKTOTAL, CKMB, CKMBINDEX, TROPONINI in the last 168 hours. CBG: Recent Labs  Lab 07/06/20 1150 07/06/20 1712 07/06/20 2003 07/07/20 0118 07/07/20 0731  GLUCAP 108* 95 107* 112* 108*   Hgb A1c Recent Labs    07/04/20 1414  HGBA1C 6.1*   Lipid Profile No results for input(s): CHOL, HDL, LDLCALC, TRIG, CHOLHDL, LDLDIRECT in the last 72 hours. Thyroid function studies No results for input(s): TSH, T4TOTAL, T3FREE, THYROIDAB in the last 72 hours.  Invalid input(s): FREET3 Anemia work up No results for input(s): VITAMINB12, FOLATE, FERRITIN, TIBC, IRON, RETICCTPCT in the last 72 hours. Urinalysis No results found for: COLORURINE, APPEARANCEUR, LABSPEC, PHURINE, GLUCOSEU, HGBUR, BILIRUBINUR, KETONESUR, PROTEINUR, UROBILINOGEN, NITRITE, LEUKOCYTESUR       Time coordinating discharge: Over 45 minutes  SIGNED: Kendell BaneSeyed A Jonael Paradiso, MD, FACP, FHM. [facility],  Please use [email or web address] to Page If 7PM-7AM, please contact night-coverage [email or web address], Password [email or web address] 07/07/2020, 10:10 AM

## 2020-07-07 NOTE — Progress Notes (Signed)
Physical Therapy Treatment Patient Details Name: Laura Bennett MRN: [id number] DOB: May 03, 1940 Today's Date: 07/07/2020    History of Present Illness 80 y.o. female, with past medical history of hyperlipidemia, anxiety, depression, prediabetes, CKD stage IIIa, recently moved from [place] in August 2022, and with history of elective right hip replacement due to osteoarthritis. L posterior THA after mechanical fall. Posterior hip precautions.    PT Comments    Cont demo increased difficulty with LE movement B in bed mobility and transfers. Improvement within session in LE strength during PT activities. Required MaxA-TotalA to complete sit to stand transfer, and easily fatigued once standing. Increased time to complete lateral stepping to [facility] for transfer back to bed. Gave handout of PT activities to complete regularly while in hospital. RN notified of mobility status. Patient will benefit from continued physical therapy in hospital and recommended venue below to increase strength, balance, endurance for safe ADLs and gait.    Follow Up Recommendations  SNF     Equipment Recommendations  None recommended by PT    Recommendations for Other Services       Precautions / Restrictions Precautions Precautions: Posterior Hip;Fall Precaution Booklet Issued: Yes (comment) Restrictions Weight Bearing Restrictions: Yes LLE Weight Bearing: Weight bearing as tolerated Other Position/Activity Restrictions: posterior hip precautions    Mobility  Bed Mobility Overal bed mobility: Needs Assistance Bed Mobility: Supine to Sit;Sit to Supine     Supine to sit: Mod assist;HOB elevated Sit to supine: Mod assist;Max assist   General bed mobility comments: difficulty moving LEs. Patient Response: Cooperative;Anxious  Transfers Overall transfer level: Needs assistance Equipment used: Rolling walker (2 wheeled) Transfers: Sit to/from Stand;Lateral/Scoot Transfers Sit to Stand: Max assist         Lateral/Scoot Transfers: Min guard General transfer comment: decreased strength, slow movements.  Ambulation/Gait Ambulation/Gait assistance: Min assist;Min guard Gait Distance (Feet): 2 Feet Assistive device: Rolling walker (2 wheeled)   Gait velocity: decreased   General Gait Details: lateral steps to [facility] to transfer to bed.   Stairs             Wheelchair Mobility    Modified Rankin (Stroke Patients Only)       Balance Overall balance assessment: Needs assistance Sitting-balance support: Bilateral upper extremity supported;Feet supported Sitting balance-Leahy Scale: Fair Sitting balance - Comments: required B UE assist to sit   Standing balance support: Bilateral upper extremity supported;During functional activity Standing balance-Leahy Scale: Poor Standing balance comment: with RW, increased WB through UEs.                            Cognition Arousal/Alertness: Awake/alert Behavior During Therapy: WFL for tasks assessed/performed Overall Cognitive Status: Within Functional Limits for tasks assessed                                        Exercises General Exercises - Lower Extremity Ankle Circles/Pumps: AROM;Strengthening;Both;10 reps;Supine Quad Sets: AROM;Strengthening;Both;10 reps;Supine Gluteal Sets: AROM;Strengthening;Both;10 reps;Supine Short Arc Quad: AROM;Strengthening;Both;10 reps    General Comments        Pertinent Vitals/Pain Pain Assessment: 0-10 Pain Score: 5  Pain Location: stomach-gas Pain Intervention(s): Monitored during session    Home Living                      Prior Function  PT Goals (current goals can now be found in the care plan section) Acute Rehab PT Goals Patient Stated Goal: get stronger PT Goal Formulation: With patient Time For Goal Achievement: 07/19/20 Potential to Achieve Goals: Good Progress towards PT goals: Progressing toward goals     Frequency    7X/week      PT Plan Current plan remains appropriate    Co-evaluation              AM-PAC PT "6 Clicks" Mobility   Outcome Measure  Help needed turning from your back to your side while in a flat bed without using bedrails?: A Little Help needed moving from lying on your back to sitting on the side of a flat bed without using bedrails?: A Lot Help needed moving to and from a bed to a chair (including a wheelchair)?: Total Help needed standing up from a chair using your arms (e.g., wheelchair or bedside chair)?: Total Help needed to walk in hospital room?: A Lot Help needed climbing 3-5 steps with a railing? : Total 6 Click Score: 10    End of Session Equipment Utilized During Treatment: Gait belt Activity Tolerance: Patient tolerated treatment well;Patient limited by fatigue;Patient limited by pain Patient left: in bed;with SCD's reapplied;with call bell/phone within reach;with family/visitor present Nurse Communication: Mobility status PT Visit Diagnosis: Unsteadiness on feet (R26.81);Other abnormalities of gait and mobility (R26.89);Repeated falls (R29.6);Muscle weakness (generalized) (M62.81)     Time: 1030-1110 PT Time Calculation (min) (ACUTE ONLY): 40 min  Charges:  $Gait Training: 8-22 mins $Therapeutic Exercise: 8-22 mins $Self Care/Home Management: 8-22                     12:12 PM,07/07/20 Esmeralda Links, PT, DPT Physical Therapist at [facility]

## 2020-07-07 NOTE — TOC Progression Note (Signed)
Transition of Care [facility]) - Progression Note    Patient Details  Name: Laura Bennett MRN: [id number] Date of Birth: 02/09/1941  Transition of Care [facility] CM/SW Contact  Barry Brunner, LCSW Phone Number: 07/07/2020, 11:30 AM  Clinical Narrative:    CSW patient's family agreeable to [facility]. CSW not able to reach [place] with [facility] admissions for discharge and insu auth. TOC to follow.   Expected Discharge Plan: Skilled Nursing Facility Barriers to Discharge: Continued Medical Work up  Expected Discharge Plan and Services Expected Discharge Plan: Skilled Nursing Facility       Living arrangements for the past 2 months: Single Family Home Expected Discharge Date: 07/07/20                                     Social Determinants of Health (SDOH) Interventions    Readmission Risk Interventions Readmission Risk Prevention Plan 07/05/2020  Medication Screening Complete  Transportation Screening Complete

## 2020-07-07 NOTE — Progress Notes (Signed)
Appropriate Use Committee Chart Review  Chart reviewed by the physician advisor with input from [facility] and the attending physician as needed for review of the appropriateness for SNF referral.  TOC notes, PT/OT/ST notes, nursing notes and physician notes reviewed for medical necessity to determine if the patient's needs are appropriate for short-term rehab to return to a prior level of function versus the likely need for custodial care.  At this time, the patient meets Medicare criteria for SNF placement.Patient with hip fracture an status post ORIF on 3/18.   Recommendations: The patient is SNF appropriate for short-term rehab/skilled nursing interventions.  A consult to the Transitions of Care Team has been made to facilitate placement.    Edsel Petrin, DO Physician Advisor  07/07/2020 9:52 AM

## 2020-07-07 NOTE — Progress Notes (Signed)
Pt oxygen level was reassessed due to prior shift vitals and o2 level being low. O2 level was 73 on room air. Nasal cannula was put on with 2L pt O2 was then 93%. MD notified and Husband was at the bedside.

## 2020-07-07 NOTE — Progress Notes (Signed)
Patient ID: Laura Bennett, female   DOB: Feb 15, 1941, 80 y.o.   MRN: [id number]  POD 2 BP (!) 106/49 (BP Location: Right Arm)   Pulse 95   Temp 99.2 F (37.3 C) (Oral)   Resp 20   Ht 5\' 4"  (1.626 m)   Wt 90 kg   SpO2 (!) 75%   BMI 34.06 kg/m   RESTING comfortably, di d well with PT   Postop plan  She is weightbearing as tolerated Aspirin for 30 days for DVT prevention Staples out at 2 weeks Office visit at 4 weeks Direct lateral hip approach precautions

## 2020-07-08 ENCOUNTER — Encounter (HOSPITAL_COMMUNITY): Payer: Self-pay | Admitting: Orthopedic Surgery

## 2020-07-08 LAB — CBC
HCT: 26.4 % — ABNORMAL LOW (ref 36.0–46.0)
Hemoglobin: 7.7 g/dL — ABNORMAL LOW (ref 12.0–15.0)
MCH: 25.2 pg — ABNORMAL LOW (ref 26.0–34.0)
MCHC: 29.2 g/dL — ABNORMAL LOW (ref 30.0–36.0)
MCV: 86.6 fL (ref 80.0–100.0)
Platelets: 221 10*3/uL (ref 150–400)
RBC: 3.05 MIL/uL — ABNORMAL LOW (ref 3.87–5.11)
RDW: 16.2 % — ABNORMAL HIGH (ref 11.5–15.5)
WBC: 8.2 10*3/uL (ref 4.0–10.5)
nRBC: 0 % (ref 0.0–0.2)

## 2020-07-08 LAB — GLUCOSE, CAPILLARY
Glucose-Capillary: 88 mg/dL (ref 70–99)
Glucose-Capillary: 129 mg/dL — ABNORMAL HIGH (ref 70–99)
Glucose-Capillary: 108 mg/dL — ABNORMAL HIGH (ref 70–99)

## 2020-07-08 LAB — HEMOGLOBIN AND HEMATOCRIT, BLOOD
HCT: 30.7 % — ABNORMAL LOW (ref 36.0–46.0)
Hemoglobin: 9.4 g/dL — ABNORMAL LOW (ref 12.0–15.0)

## 2020-07-08 MED ORDER — FUROSEMIDE 10 MG/ML IJ SOLN
40.0000 mg | Freq: Once | INTRAMUSCULAR | Status: AC
  Administered 2020-07-08: 40 mg via INTRAVENOUS
  Filled 2020-07-08: qty 4

## 2020-07-08 NOTE — Progress Notes (Signed)
Physical Therapy Treatment Patient Details Name: Laura Bennett MRN: [id number] DOB: Jan 08, 1941 Today's Date: 07/08/2020    History of Present Illness 80 y.o. female, with past medical history of hyperlipidemia, anxiety, depression, prediabetes, CKD stage IIIa, recently moved from [place] in August 2022, and with history of elective right hip replacement due to osteoarthritis. L posterior THA after mechanical fall. Posterior hip precautions.    PT Comments    Demo good improvement in bed mobility, transfers, and static stance vs yesterday's session. Improved attempts to stand, requiring decreased assistance and attempts before attaining standing. x1 removed both hands from RW and statically stood before realizing no UE support and placed 1 UE back onto RW. Improved weight shifting and pt reports that it felt good. Cont difficulty with bed mobility secondary to LE weakness and pain, but improved movement vs weekend bed mobility. Continues to struggle with pain management, but patent stated that she refused pain medication the last 2 times it was offered. RN notified of patient pain and patient mobility status. Patient will benefit from continued physical therapy in hospital and recommended venue below to increase strength, balance, endurance for safe ADLs and gait.     Follow Up Recommendations  SNF     Equipment Recommendations  None recommended by PT    Recommendations for Other Services       Precautions / Restrictions Precautions Precautions: Posterior Hip;Fall Precaution Booklet Issued: Yes (comment) Restrictions Weight Bearing Restrictions: Yes LLE Weight Bearing: Weight bearing as tolerated Other Position/Activity Restrictions: posterior hip precautions    Mobility  Bed Mobility Overal bed mobility: Needs Assistance Bed Mobility: Supine to Sit;Sit to Supine     Supine to sit: Mod assist;HOB elevated Sit to supine: Mod assist;HOB elevated   General bed mobility comments:  difficulty moving LEs. Patient Response: Cooperative  Transfers Overall transfer level: Needs assistance Equipment used: Rolling walker (2 wheeled) Transfers: Sit to/from Stand;Lateral/Scoot Transfers Sit to Stand: Mod assist;Max assist        Lateral/Scoot Transfers: Min guard General transfer comment: decreased strength, slow movements.  Ambulation/Gait Ambulation/Gait assistance: Min assist;Min guard Gait Distance (Feet): 3 Feet Assistive device: Rolling walker (2 wheeled)   Gait velocity: decreased   General Gait Details: lateral steps to [facility] to transfer to bed.   Stairs             Wheelchair Mobility    Modified Rankin (Stroke Patients Only)       Balance Overall balance assessment: Needs assistance Sitting-balance support: Feet supported;Single extremity supported Sitting balance-Leahy Scale: Good Sitting balance - Comments: decreased UE assist to sit   Standing balance support: During functional activity;No upper extremity supported Standing balance-Leahy Scale: Fair Standing balance comment: with RW, decreased WB through UEs vs previous session, improved WB through LEs, x1 lift B hands off RW                            Cognition Arousal/Alertness: Awake/alert Behavior During Therapy: WFL for tasks assessed/performed Overall Cognitive Status: Within Functional Limits for tasks assessed                                        Exercises General Exercises - Lower Extremity Ankle Circles/Pumps: AROM;Strengthening;Both;10 reps;Supine;Seated Quad Sets: AROM;Strengthening;Both;10 reps;Supine Gluteal Sets: AROM;Strengthening;Both;10 reps;Supine Long Arc Quad: AROM;Strengthening;Both;10 reps Other Exercises Other Exercises: weight shifting in RW anterior-posterior and right-left  General Comments        Pertinent Vitals/Pain Pain Assessment: 0-10 Pain Score: 6  Pain Location: leg L Pain Intervention(s): Limited  activity within patient's tolerance;Monitored during session    Home Living                      Prior Function            PT Goals (current goals can now be found in the care plan section) Acute Rehab PT Goals Patient Stated Goal: get stronger PT Goal Formulation: With patient Time For Goal Achievement: 07/19/20 Potential to Achieve Goals: Good    Frequency    7X/week      PT Plan      Co-evaluation              AM-PAC PT "6 Clicks" Mobility   Outcome Measure  Help needed turning from your back to your side while in a flat bed without using bedrails?: A Little Help needed moving from lying on your back to sitting on the side of a flat bed without using bedrails?: A Lot Help needed moving to and from a bed to a chair (including a wheelchair)?: A Lot Help needed standing up from a chair using your arms (e.g., wheelchair or bedside chair)?: A Lot Help needed to walk in hospital room?: A Lot Help needed climbing 3-5 steps with a railing? : Total 6 Click Score: 12    End of Session Equipment Utilized During Treatment: Gait belt Activity Tolerance: Patient tolerated treatment well Patient left: in bed;with SCD's reapplied;with call bell/phone within reach Nurse Communication: Mobility status PT Visit Diagnosis: Unsteadiness on feet (R26.81);Other abnormalities of gait and mobility (R26.89);Repeated falls (R29.6);Muscle weakness (generalized) (M62.81)     Time: 1950-9326 PT Time Calculation (min) (ACUTE ONLY): 40 min  Charges:  $Therapeutic Exercise: 8-22 mins $Therapeutic Activity: 23-37 mins                     9:20 AM,07/08/20 Esmeralda Links, PT, DPT Physical Therapist at [facility]    Valeta Harms 07/08/2020, 9:18 AM

## 2020-07-08 NOTE — Care Management Important Message (Signed)
Important Message  Patient Details  Name: Laura Bennett MRN: [id number] Date of Birth: 02-09-1941   Medicare Important Message Given:  Yes     Corey Harold 07/08/2020, 1:37 PM

## 2020-07-08 NOTE — Progress Notes (Signed)
Physician Discharge Summary [facility]    Patient: Laura Bennett                   Admit date: 07/04/2020   DOB: 02/13/1941             Discharge date:07/08/2020/10:53 AM BMW:[id number]                          PCP: Trisha MangleMorrison, Hayden Byrd, FNP  Disposition:  SNF  Recommendations for Outpatient Follow-up:   . Follow up: in 2 week with ortho team  .   Discharge Condition: Stable   Code Status:   Code Status: Full Code  Diet recommendation: Regular healthy diet   The patient was seen and examined, stable required 2 L of oxygen, currently satting 97%. Chest x-ray was clear, with minimal atelectasis, ABG within normal limits-consistent with mild hypercarbia Patient is shallow breathing -encouraged deep breathing, using incentive spirometer and flutter valve One-time Lasix was given.  Patient stable for discharge to SNF today-pending insurance approval.    Discharge Diagnoses:    Active Problems:   Closed left hip fracture [facility](HCC)   Essential hypertension   Prediabetes   Depression   History of Present Illness/ Hospital Course Laura Bennett/Brief Summary:   80 y.o.female,with past medical history of hyperlipidemia, anxiety, depression, prediabetes, CKD stage IIIa, recently moved from [place] in August 2022, and with history of elective right hip replacement due to osteoarthritis   Left hip fracture- -postop day #2  -status post ORIF by Dr. Romeo AppleHarrison on 07/05/2020 -Status post mechanical fall  -Orthopedic surgeon recommends--weightbearing as tolerated Aspirin 325 PO daily -for 30 days for DVT prevention Staples out at 2 weeks Office visit at 4 weeks Direct lateral hip approach precautions -Management as per orthopedic team  -Pending PT evaluation today, insurance approval for SNF -Mild drop in H&H noted: 10.8, 8.2-no signs of bleeding -monitor hemoglobin -if below 7 or symptomatic will transfuse  Left lung base nodule -This is incidental finding on chest x-ray,  will need CT chest on nonemergent patient, can be done as an outpatient,  --Patient and husband aware -Patient recently moved from [place], discussed with husband, he will try to check with her PCP in [place] if she had any previous chest x-ray in the past with similar findings. -She denies any history of smoking. -Currently stable  Anxiety/depression -Continue Cymbalta  Hyperlipidemia -Resuming pravastatin  PreDiabetes -Husband reports most recent A1c is 6.1, -last CBG 95, 107, 112, 108 -Please use Novolog/Humalog Sliding scale insulin with Accu-Cheks/Fingersticks - F/up with PCP regarding medication if needed in 1-2 wks    GERD -Continue with Protonix  Stage IIIa CKD -At baseline, continue to monitor.  Thyroid disease -Continue with levothyroxine 137 mcg   Disposition: The patient is from: Home  Anticipated d/c is to: SNF   Code Status :  -  Code Status: Full Code  Family Communication: Discussed with husband at bedside Consults  :  ortho  Nutritional status:  Nutrition Problem: Increased nutrient needs Etiology: post-op healing Signs/Symptoms: estimated needs Interventions: Ensure Enlive (each supplement provides 350kcal and 20 grams of protein),MVI  The patient's BMI is: Body mass index is 34.06 kg/m.  DVT Prophylaxis  :   -Aspirin 325 mg p.o. daily per orthopedic recommendation Small bowel SCDs   SCDs Start: 07/05/20 1758 Place TED hose Start: 07/05/20 1758 SCDs Start: 07/04/20 1843  Discharge Instructions:   Discharge Instructions    Activity as tolerated -  No restrictions   Complete by: As directed    Call MD for:  difficulty breathing, headache or visual disturbances   Complete by: As directed    Call MD for:  redness, tenderness, or signs of infection (pain, swelling, redness, odor or green/yellow discharge around incision site)   Complete by: As directed    Diet - low sodium heart healthy   Complete by: As  directed    Discharge instructions   Complete by: As directed    Continue PT OT per instructions, fall precautions DVT prophylaxis per Ortho recommendation: Continue aspirin 325 mg p.o. daily continue knee-high stockings, compression devices while in bed and sitting down   Discharge wound care:   Complete by: As directed    Per surgery/orthopedic recommendations, follow-up with orthopedic team   Discharge wound care:   Complete by: As directed    As directed by surgery   Discharge wound care:   Complete by: As directed    Orthopedic surgery recommendations   Increase activity slowly   Complete by: As directed        Medication List    STOP taking these medications   Black Cohosh 540 MG Caps     TAKE these medications   amitriptyline 10 MG tablet Commonly known as: ELAVIL Take 10 mg by mouth at bedtime.   aspirin 325 MG EC tablet Take 1 tablet (325 mg total) by mouth daily with breakfast.   Cholecalciferol 25 MCG (1000 UT) tablet Take 2,000 Units by mouth daily.   diclofenac 75 MG EC tablet Commonly known as: VOLTAREN Take 75 mg by mouth 2 (two) times daily.   docusate sodium 100 MG capsule Commonly known as: COLACE Take 1 capsule (100 mg total) by mouth 2 (two) times daily.   DULoxetine 30 MG capsule Commonly known as: CYMBALTA Take 60 mg by mouth daily.   estradiol 0.1 MG/GM vaginal cream Commonly known as: ESTRACE Place 0.5 g vaginally daily.   gabapentin 100 MG capsule Commonly known as: NEURONTIN Take 200 mg by mouth daily.   HYDROcodone-acetaminophen 5-325 MG tablet Commonly known as: NORCO/VICODIN Take 1-2 tablets by mouth every 6 (six) hours as needed for up to 3 days for moderate pain.   levothyroxine 137 MCG tablet Commonly known as: SYNTHROID Take 137 mcg by mouth every morning.   menthol-cetylpyridinium 3 MG lozenge Commonly known as: CEPACOL Take 1 lozenge (3 mg total) by mouth as needed for sore throat.   methocarbamol 500 MG  tablet Commonly known as: ROBAXIN Take 1 tablet (500 mg total) by mouth every 6 (six) hours as needed for up to 10 days for muscle spasms.   nystatin powder Commonly known as: MYCOSTATIN/NYSTOP Apply topically as needed (moisture).   omeprazole 20 MG capsule Commonly known as: PRILOSEC Take 20 mg by mouth daily.   ondansetron 4 MG tablet Commonly known as: ZOFRAN Take 1 tablet (4 mg total) by mouth every 6 (six) hours as needed for nausea.   oxybutynin 15 MG 24 hr tablet Commonly known as: DITROPAN XL Take 15 mg by mouth daily.   phenol 1.4 % Liqd Commonly known as: CHLORASEPTIC Use as directed 1 spray in the mouth or throat as needed for throat irritation / pain.   pravastatin 20 MG tablet Commonly known as: PRAVACHOL Take 20 mg by mouth at bedtime.   senna-docusate 8.6-50 MG tablet Commonly known as: Senokot-S Take 1 tablet by mouth at bedtime as needed for mild constipation.   simethicone 80 MG chewable  tablet Commonly known as: MYLICON Chew 1 tablet (80 mg total) by mouth 4 (four) times daily as needed for flatulence.   Vitamin B Complex Tabs Take 1 tablet by mouth daily.       Contact information for after-discharge care    Destination    [facility] Preferred SNF .   Service: Skilled Nursing Contact information: [address] [place] [address] [phone]                 No Known Allergies   Procedures /Studies:   DG Chest 2 View  Result Date: 07/04/2020 CLINICAL DATA:  Status post fall. EXAM: CHEST - 2 VIEW COMPARISON:  None. FINDINGS: Hazy opacification of the mid and lower right lung is seen. An overlying, partially calcified right breast implant is also noted. A left breast implant is seen lateral to the left hemithorax. A 1.2 cm x 0.8 cm focal opacity is seen overlying the lateral aspect of the left lung base. No pneumothorax is identified. The heart size and mediastinal contours  are within normal limits. There is tortuosity of the descending thoracic aorta. Degenerative changes seen throughout the thoracic spine. IMPRESSION: 1. Partially calcified right breast implant. Mild airspace disease within the mid to lower right lung cannot be excluded. 2. Focal opacity overlying the left lung base which may represent an underlying lung nodule. Correlation with nonemergent chest CT is recommended. Electronically Signed   By: Aram Candela M.D.   On: 07/04/2020 15:29   DG Pelvis Portable  Result Date: 07/05/2020 CLINICAL DATA:  Left hip replacement. EXAM: PORTABLE PELVIS 1-2 VIEWS COMPARISON:  Left hip x-rays from yesterday. FINDINGS: The left hip demonstrates a hemiarthroplasty without evidence of hardware failure or complication. There is no fracture or dislocation. The alignment is anatomic. Post-surgical changes noted in the surrounding soft tissues. Prior right total hip arthroplasty. IMPRESSION: 1. Interval left hip hemiarthroplasty without acute postoperative complication. Electronically Signed   By: Obie Dredge M.D.   On: 07/05/2020 15:47   DG CHEST PORT 1 VIEW  Result Date: 07/07/2020 CLINICAL DATA:  Shortness of breath, weakness EXAM: PORTABLE CHEST 1 VIEW COMPARISON:  07/04/2020 FINDINGS: Improved aeration throughout the right lung. No confluent opacity on the right. Left basilar opacity is increased and could reflect atelectasis or infiltrate. Heart is upper limits normal in size. No effusions. IMPRESSION: Improved aeration on the right. Increasing left basilar atelectasis or infiltrate. Electronically Signed   By: Charlett Nose M.D.   On: 07/07/2020 15:32   DG Hip Unilat W or Wo Pelvis 2-3 Views Left  Result Date: 07/04/2020 CLINICAL DATA:  Status post fall. EXAM: DG HIP (WITH OR WITHOUT PELVIS) 2-3V LEFT COMPARISON:  None. FINDINGS: A total right hip replacement is seen. There is no evidence of surrounding lucency to suggest the presence of hardware loosening or  infection. Acute fracture deformity is seen extending through the neck of the proximal left femur. Approximately 1/2 shaft width dorsal displacement of the distal fracture site is noted. There is no evidence of dislocation. IMPRESSION: Acute fracture of the proximal left femur. Electronically Signed   By: Aram Candela M.D.   On: 07/04/2020 15:25    Subjective:   Patient was seen and examined 07/08/2020, 10:53 AM Patient stable today. No acute distress.  No issues overnight Stable for discharge.  Discharge Exam:    Vitals:   07/07/20 1500 07/07/20 1941 07/07/20 1953 07/08/20 0504  BP: 110/67  (!) 106/59 Marland Kitchen)  119/54  Pulse: (!) 104 73 99 93  Resp:  Temp: 98.2 F (36.8 C)  (!) 97.4 F (36.3 C) 98.1 F (36.7 C)  TempSrc: Oral     SpO2: 94% 93% 95% 99%  Weight:      Height:           Physical Exam:   General:  Alert, oriented, cooperative, no distress;   HEENT:  Normocephalic, PERRL, otherwise with in Normal limits   Neuro:  CNII-XII intact. , normal motor and sensation, reflexes intact   Lungs:   Clear to auscultation BL, Respirations unlabored, no wheezes / crackles  Cardio:    S1/S2, RRR, No murmure, No Rubs or Gallops   Abdomen:   Soft, non-tender, bowel sounds active all four quadrants,  no guarding or peritoneal signs.  Muscular skeletal:  Limited exam - in bed, able to move all 4 extremities, Normal strength,  2+ pulses,  symmetric, No pitting edema  Skin:  Dry, warm to touch, negative for any Rashes,  Wounds: Please see nursing documentation              The results of significant diagnostics from this hospitalization (including imaging, microbiology, ancillary and laboratory) are listed below for reference.      Microbiology:   Recent Results (from the past 240 hour(s))  Resp Panel by RT-PCR (Flu A&B, Covid) Nasopharyngeal Swab     Status: None   Collection Time: 07/04/20  4:49 PM   Specimen: Nasopharyngeal Swab; Nasopharyngeal(NP) swabs in  vial transport medium  Result Value Ref Range Status   SARS Coronavirus 2 by RT PCR NEGATIVE NEGATIVE Final    Comment: (NOTE) SARS-CoV-2 target nucleic acids are NOT DETECTED.  The SARS-CoV-2 RNA is generally detectable in upper respiratory specimens during the acute phase of infection. The lowest concentration of SARS-CoV-2 viral copies this assay can detect is 138 copies/mL. A negative result does not preclude SARS-Cov-2 infection and should not be used as the sole basis for treatment or other patient management decisions. A negative result may occur with  improper specimen collection/handling, submission of specimen other than nasopharyngeal swab, presence of viral mutation(s) within the areas targeted by this assay, and inadequate number of viral copies(<138 copies/mL). A negative result must be combined with clinical observations, patient history, and epidemiological information. The expected result is Negative.  Fact Sheet for Patients:  BloggerCourse.com  Fact Sheet for Healthcare Providers:  SeriousBroker.it  This test is no t yet approved or cleared by the [place] FDA and  has been authorized for detection and/or diagnosis of SARS-CoV-2 by FDA under an Emergency Use Authorization (EUA). This EUA will remain  in effect (meaning this test can be used) for the duration of the COVID-19 declaration under Section 564(b)(1) of the Act, 21 U.S.C.section 360bbb-3(b)(1), unless the authorization is terminated  or revoked sooner.       Influenza A by PCR NEGATIVE NEGATIVE Final   Influenza B by PCR NEGATIVE NEGATIVE Final    Comment: (NOTE) The Xpert Xpress SARS-CoV-2/FLU/RSV plus assay is intended as an aid in the diagnosis of influenza from Nasopharyngeal swab specimens and should not be used as a sole basis for treatment. Nasal washings and aspirates are unacceptable for Xpert Xpress SARS-CoV-2/FLU/RSV testing.  Fact  Sheet for Patients: BloggerCourse.com  Fact Sheet for Healthcare Providers: SeriousBroker.it  This test is not yet approved or cleared by the [place] FDA and has been authorized for detection and/or diagnosis of SARS-CoV-2 by  FDA under an Emergency Use Authorization (EUA). This EUA will remain in effect (meaning this test can be used) for the duration of the COVID-19 declaration under Section 564(b)(1) of the Act, 21 U.S.C. section 360bbb-3(b)(1), unless the authorization is terminated or revoked.  Performed at [facility], [address] [place], [place] [address]   Surgical PCR screen     Status: None   Collection Time: 07/04/20  9:10 PM   Specimen: Nasal Mucosa; Nasal Swab  Result Value Ref Range Status   MRSA, PCR NEGATIVE NEGATIVE Final   Staphylococcus aureus NEGATIVE NEGATIVE Final    Comment: (NOTE) The Xpert SA Assay (FDA approved for NASAL specimens in patients 47 years of age and older), is one component of a comprehensive surveillance program. It is not intended to diagnose infection nor to guide or monitor treatment. Performed at [facility], [address] [place], [place] [address]   SARS CORONAVIRUS 2 (TAT 6-24 HRS) Nasopharyngeal Nasopharyngeal Swab     Status: None   Collection Time: 07/07/20  8:52 AM   Specimen: Nasopharyngeal Swab  Result Value Ref Range Status   SARS Coronavirus 2 NEGATIVE NEGATIVE Final    Comment: (NOTE) SARS-CoV-2 target nucleic acids are NOT DETECTED.  The SARS-CoV-2 RNA is generally detectable in upper and lower respiratory specimens during the acute phase of infection. Negative results do not preclude SARS-CoV-2 infection, do not rule out co-infections with other pathogens, and should not be used as the sole basis for treatment or other patient management decisions. Negative results must be combined with clinical observations, patient history, and epidemiological  information. The expected result is Negative.  Fact Sheet for Patients: HairSlick.no  Fact Sheet for Healthcare Providers: quierodirigir.com  This test is not yet approved or cleared by the [place] FDA and  has been authorized for detection and/or diagnosis of SARS-CoV-2 by FDA under an Emergency Use Authorization (EUA). This EUA will remain  in effect (meaning this test can be used) for the duration of the COVID-19 declaration under Se ction 564(b)(1) of the Act, 21 U.S.C. section 360bbb-3(b)(1), unless the authorization is terminated or revoked sooner.  Performed at [facility], [address], [place], [place] [address]      Labs:   CBC: Recent Labs  Lab 07/04/20 1414 07/05/20 0620 07/06/20 0522 07/07/20 0436 07/08/20 0406 07/08/20 0912  WBC 7.6 8.0 8.9 9.8 8.2  --   NEUTROABS 4.7  --   --   --   --   --   HGB 10.8* 10.8* 8.2* 8.4* 7.7* 9.4*  HCT 34.8* 36.2 27.9* 28.3* 26.4* 30.7*  MCV 83.7 85.8 86.6 86.8 86.6  --   PLT 286 243 202 220 221  --    Basic Metabolic Panel: Recent Labs  Lab 07/04/20 1414 07/05/20 0620 07/06/20 0522  NA 133* 134* 135  K 4.8 4.5 4.4  CL 102 100 102  CO2 22 23 26   GLUCOSE 82 101* 100*  BUN 13 14 10   CREATININE 0.95 1.03* 0.90  CALCIUM 9.1 8.9 8.0*   Liver Function Tests: No results for input(s): AST, ALT, ALKPHOS, BILITOT, PROT, ALBUMIN in the last 168 hours. BNP (last 3 results) No results for input(s): BNP in the last 8760 hours. Cardiac Enzymes: No results for input(s): CKTOTAL, CKMB, CKMBINDEX, TROPONINI in the last 168 hours. CBG: Recent Labs  Lab 07/07/20 0731 07/07/20 1135 07/07/20 1636 07/07/20 1950 07/08/20 0726  GLUCAP 108* 101* 111* 108* 88   Hgb A1c No results  for input(s): HGBA1C in the last 72 hours. Lipid Profile No results for input(s): CHOL, HDL, LDLCALC, TRIG, CHOLHDL, LDLDIRECT in the last 72 hours. Thyroid function studies No  results for input(s): TSH, T4TOTAL, T3FREE, THYROIDAB in the last 72 hours.  Invalid input(s): FREET3 Anemia work up No results for input(s): VITAMINB12, FOLATE, FERRITIN, TIBC, IRON, RETICCTPCT in the last 72 hours. Urinalysis No results found for: COLORURINE, APPEARANCEUR, LABSPEC, PHURINE, GLUCOSEU, HGBUR, BILIRUBINUR, KETONESUR, PROTEINUR, UROBILINOGEN, NITRITE, LEUKOCYTESUR       Time coordinating discharge: Over 45 minutes  SIGNED: Kendell Bane, MD, FACP, FHM. [facility],  Please use [email or web address] to Page If 7PM-7AM, please contact night-coverage [email or web address] 07/08/2020, 10:53 AM

## 2020-07-08 NOTE — Progress Notes (Signed)
OT Cancellation Note  Patient Details Name: Angelynn Lemus MRN: [id number] DOB: 1940-07-22   Cancelled Treatment:    Reason Eval/Treat Not Completed: Patient declined, no reason specified. Pt declined OT evaluation reporting she had already been up and had a bath and did not want to get out of bed again. Pt was in bed with lights off upon this therapists arrival. Will evaluate pt later as time permits.   Deirdre Gryder OT, MOT  Danie Chandler 07/08/2020, 11:43 AM

## 2020-07-08 NOTE — TOC Transition Note (Signed)
Transition of Care [facility]) - CM/SW Discharge Note   Patient Details  Name: Laura Bennett MRN: [id number] Date of Birth: Sep 30, 1940  Transition of Care [facility]) CM/SW Contact:  Karn Cassis, LCSW Phone Number: 07/08/2020, 2:53 PM   Clinical Narrative:  Pt d/c today to [facility]. Auth received [phone], next review 3/23). Pt, pt's husband, RN, and facility aware and agreeable. COVID negative 07/07/2020. D/C summary faxed. RN given number to call report. Pt will transport by [facility].      Final next level of care: Skilled Nursing Facility Barriers to Discharge: Barriers Resolved   Patient Goals and CMS Choice Patient states their goals for this hospitalization and ongoing recovery are:: to go to SNF. CMS Medicare.gov Compare Post Acute Care list provided to:: Patient Represenative (must comment) Choice offered to / list presented to : Spouse  Discharge Placement   Existing PASRR number confirmed : 07/08/20          Patient chooses bed at: Other - please specify in the comment section below: ([facility]) Patient to be transferred to facility by: [facility] Name of family member notified: husband Patient and family notified of of transfer: 07/08/20  Discharge Plan and Services                DME Arranged: N/A DME Agency: NA                  Social Determinants of Health (SDOH) Interventions     Readmission Risk Interventions Readmission Risk Prevention Plan 07/05/2020  Medication Screening Complete  Transportation Screening Complete

## 2020-07-08 NOTE — TOC Progression Note (Signed)
Transition of Care [facility]) - Progression Note    Patient Details  Name: Teira Arcilla MRN: [id number] Date of Birth: 05/27/1940  Transition of Care [facility]) CM/SW Contact  Karn Cassis, [place] Phone Number: 07/08/2020, 8:35 AM  Clinical Narrative:  LCSW notified Elease Hashimoto at [facility] that bed accepted. LCSW started [facility] authorization.      Expected Discharge Plan: Skilled Nursing Facility Barriers to Discharge: Continued Medical Work up  Expected Discharge Plan and Services Expected Discharge Plan: Skilled Nursing Facility       Living arrangements for the past 2 months: Single Family Home Expected Discharge Date: 07/07/20                                     Social Determinants of Health (SDOH) Interventions    Readmission Risk Interventions Readmission Risk Prevention Plan 07/05/2020  Medication Screening Complete  Transportation Screening Complete

## 2020-07-09 LAB — TYPE AND SCREEN
ABO/RH(D): O POS
Antibody Screen: NEGATIVE
Unit division: 0

## 2020-07-09 LAB — BPAM RBC
Blood Product Expiration Date: 202204072359
Unit Type and Rh: 9500

## 2020-07-15 ENCOUNTER — Telehealth: Payer: Self-pay | Admitting: Orthopedic Surgery

## 2020-07-15 NOTE — Telephone Encounter (Signed)
Called left message for [facility]  Dry dressing / the surgical bandage was supposed to stay on for 14 days but if it has been removed ok to place dry dressing on the incision.  Asked if they can take out the staples day 14. Advised her if they can not to let us know.

## 2020-07-15 NOTE — Telephone Encounter (Signed)
I called her to discuss.  Left message Shower after the staples out, if she has a shower chair.  Asked her to call back about the swelling, she can wear stockings if she wants  If both are very swollen and swelling does not come down at night she should call PCP

## 2020-07-15 NOTE — Telephone Encounter (Addendum)
She did call back and we discussed No shower until staples come out She has discussed swollen legs with her PCP and they have sent her in something for the swelling. She will try to put on the stockings that she has / they are more like socks, I told her that would be fine. She said she has some stockings but was unable to get them on because they were so swollen  She confirms she is using the aspirin  To you FYI

## 2020-07-15 NOTE — Telephone Encounter (Signed)
Patient called and requested a follow up post op appointment.  Per Dr Mort Sawyers op note from 07/05/20, an appointment was given for Wednesday, 08/07/20 at 11:30.  She also said that she is having some swelling in her lower legs and feet.  She wants to know if she is supposed to be using some kind of compression stockings?  She also wants to know whether or not she can take a shower?  She is at home and is aware that [facility] will be coming out to see her.   Please call her with answers to these questions.  Thanks

## 2020-07-15 NOTE — Telephone Encounter (Signed)
Christy from [facility] called and had some questions regarding this patient.    She wants to know about incision care.  She states that incision looks great but needs to know whether to do a dry dressing?  She also wants to know if the patient can take a shower?  She also wants to know when the staples should be removed and who will remove those  Please call Neysa Bonito from [facility] at [phone]  Thanks

## 2020-07-16 ENCOUNTER — Telehealth: Payer: Self-pay | Admitting: Orthopedic Surgery

## 2020-07-16 NOTE — Telephone Encounter (Signed)
Christy from [facility] called and left voicemail.  She states she could not understand all of your message regarding this patient.  She said she did understand about doing dry dressing but was unsure regarding staple removal.  She said you may call her back on her cell at [phone] or call the office and speak to Laney Potash who will take the orders.  That phone number is [phone]  Thanks

## 2020-07-16 NOTE — Telephone Encounter (Signed)
I called back to advise her to remove staples day 14. Left message.

## 2020-07-29 ENCOUNTER — Telehealth: Payer: Self-pay | Admitting: Orthopedic Surgery

## 2020-07-29 NOTE — Telephone Encounter (Signed)
Patient states she did see her PCP and has been given a fluid pill.

## 2020-07-29 NOTE — Telephone Encounter (Signed)
Continue the stockings Continue ASA 325  Call primary care if swelling not coming down that is worrisome. I called left message for her to call me back about this so we can discuss

## 2020-07-29 NOTE — Telephone Encounter (Signed)
Patient called stating she is worried her feet and legs are swollen and she is soaking her feet in Epsom salt and is wearing  compression socks up to her knee and nothing is working.  Please call her back

## 2020-08-07 ENCOUNTER — Ambulatory Visit (INDEPENDENT_AMBULATORY_CARE_PROVIDER_SITE_OTHER): Payer: Medicare Other | Admitting: Orthopedic Surgery

## 2020-08-07 ENCOUNTER — Encounter: Payer: Self-pay | Admitting: Orthopedic Surgery

## 2020-08-07 ENCOUNTER — Other Ambulatory Visit: Payer: Self-pay

## 2020-08-07 ENCOUNTER — Telehealth: Payer: Self-pay | Admitting: Radiology

## 2020-08-07 DIAGNOSIS — S72002A Fracture of unspecified part of neck of left femur, initial encounter for closed fracture: Secondary | ICD-10-CM

## 2020-08-07 DIAGNOSIS — R600 Localized edema: Secondary | ICD-10-CM

## 2020-08-07 NOTE — Progress Notes (Signed)
Chief Complaint  Patient presents with  . Post-op Follow-up    07/05/20 right hip    Procedure bipolar replacement of the left hip  Preop diagnosis left hip femoral neck fracture  Postop diagnosis same  Procedure bipolar partial left hip replacement  Implants [employer] [place] basic press-fit size 5 fracture stem, 47 outer head, 28 mm inner head with 1.5 neck length   80 year old female with bilateral leg edema.  She says she did not have any leg edema prior to surgery but her records indicates she has some chronic kidney disease may be her GFR is sluggish  She presents with bilateral edema tenderness with pitting edema from the toes up to the knee  She says she feels knots behind her left knee so we are sending her for an ultrasound Doppler to rule out a DVT  She was complaining of some hip pain which turned out to be back pain which is most likely from gait abnormality postop  Her incision looks clean  Recommend compression stockings for the edema  See primary care regarding kidney function  Follow-up in 3 weeks continue weight-bear as tolerated direct lateral hip precautions

## 2020-08-07 NOTE — Telephone Encounter (Signed)
Doppler study tomorrow at 10 am [facility] called patient with details she voiced understanding

## 2020-08-08 ENCOUNTER — Ambulatory Visit (HOSPITAL_COMMUNITY)
Admission: RE | Admit: 2020-08-08 | Discharge: 2020-08-08 | Disposition: A | Discharge status: Home / Self Care | Payer: Medicare Other | Source: Ambulatory Visit | Attending: Orthopedic Surgery | Admitting: Orthopedic Surgery

## 2020-08-08 ENCOUNTER — Telehealth: Payer: Self-pay | Admitting: Radiology

## 2020-08-08 DIAGNOSIS — S72002A Fracture of unspecified part of neck of left femur, initial encounter for closed fracture: Secondary | ICD-10-CM

## 2020-08-08 DIAGNOSIS — R600 Localized edema: Secondary | ICD-10-CM

## 2020-08-08 NOTE — Telephone Encounter (Signed)
Yes, I called her to advise  

## 2020-08-08 NOTE — Progress Notes (Signed)
Lower extremity venous bilateral study completed.   Please see CV Proc for preliminary results.   Dione Mccombie, RDMS, RVT  

## 2020-08-08 NOTE — Telephone Encounter (Signed)
-----   Message from Vickki Hearing, MD sent at 08/08/2020 12:13 PM EDT ----- Will u do me a favor and let her know theres no clot

## 2020-08-12 ENCOUNTER — Other Ambulatory Visit (HOSPITAL_COMMUNITY): Payer: Self-pay | Admitting: Family Medicine

## 2020-08-12 DIAGNOSIS — T17308A Unspecified foreign body in larynx causing other injury, initial encounter: Secondary | ICD-10-CM

## 2020-08-14 ENCOUNTER — Ambulatory Visit (HOSPITAL_COMMUNITY)
Admission: RE | Admit: 2020-08-14 | Discharge: 2020-08-14 | Disposition: A | Discharge status: Home / Self Care | Payer: Medicare Other | Source: Ambulatory Visit | Attending: Family Medicine | Admitting: Family Medicine

## 2020-08-14 DIAGNOSIS — T17308A Unspecified foreign body in larynx causing other injury, initial encounter: Secondary | ICD-10-CM | POA: Diagnosis not present

## 2020-09-04 ENCOUNTER — Encounter: Payer: Self-pay | Admitting: Orthopedic Surgery

## 2020-09-04 ENCOUNTER — Other Ambulatory Visit: Payer: Self-pay

## 2020-09-04 ENCOUNTER — Ambulatory Visit (INDEPENDENT_AMBULATORY_CARE_PROVIDER_SITE_OTHER): Payer: Medicare Other | Admitting: Orthopedic Surgery

## 2020-09-04 DIAGNOSIS — S72002A Fracture of unspecified part of neck of left femur, initial encounter for closed fracture: Secondary | ICD-10-CM

## 2020-09-04 NOTE — Progress Notes (Signed)
POST OP  Chief Complaint  Patient presents with  . Post-op Follow-up    Left hip     Encounter Diagnosis  Name Primary?  . 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

## 2020-09-11 DIAGNOSIS — S72009A Fracture of unspecified part of neck of unspecified femur, initial encounter for closed fracture: Secondary | ICD-10-CM | POA: Insufficient documentation

## 2020-12-16 ENCOUNTER — Telehealth: Payer: Self-pay | Admitting: Orthopedic Surgery

## 2020-12-16 NOTE — Telephone Encounter (Signed)
Patient called/confirmed appointment for 01/06/21; she is asking about new problem of Right hip, which she relays [place], prior to moving here.  I offered appointment as a separate/new problem visit, prior to her 01/06/21 visit. States may be getting a referral as well, from primary care through [facility]. Patient said wants to hold after all, and states wants to discuss with Dr Romeo Apple at time of her September appointment.

## 2021-01-06 ENCOUNTER — Other Ambulatory Visit: Payer: Self-pay

## 2021-01-06 ENCOUNTER — Ambulatory Visit (INDEPENDENT_AMBULATORY_CARE_PROVIDER_SITE_OTHER): Payer: Medicare Other | Admitting: Orthopedic Surgery

## 2021-01-06 ENCOUNTER — Encounter: Payer: Self-pay | Admitting: Orthopedic Surgery

## 2021-01-06 VITALS — BP 127/88 | HR 79 | Ht 66.0 in | Wt 199.6 lb

## 2021-01-06 DIAGNOSIS — S72002A Fracture of unspecified part of neck of left femur, initial encounter for closed fracture: Secondary | ICD-10-CM | POA: Diagnosis not present

## 2021-01-06 NOTE — Progress Notes (Signed)
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  6 months postop doing well we released her follow-up as needed

## 2021-10-24 ENCOUNTER — Ambulatory Visit (INDEPENDENT_AMBULATORY_CARE_PROVIDER_SITE_OTHER): Payer: Medicare Other

## 2021-10-24 ENCOUNTER — Encounter: Payer: Self-pay | Admitting: Orthopedic Surgery

## 2021-10-24 ENCOUNTER — Ambulatory Visit (INDEPENDENT_AMBULATORY_CARE_PROVIDER_SITE_OTHER): Payer: Medicare Other | Admitting: Orthopedic Surgery

## 2021-10-24 VITALS — BP 135/81 | HR 97 | Ht 66.0 in | Wt 212.0 lb

## 2021-10-24 DIAGNOSIS — S92335A Nondisplaced fracture of third metatarsal bone, left foot, initial encounter for closed fracture: Secondary | ICD-10-CM

## 2021-10-24 DIAGNOSIS — M79672 Pain in left foot: Secondary | ICD-10-CM | POA: Diagnosis not present

## 2021-10-24 NOTE — Progress Notes (Signed)
Chief Complaint  Patient presents with   Foot Pain    Workin today for foot pain left states a cabinet door hit foot a month ago states had xrays but does not have images.    Estimated date of injury first week of June   HPI: This is a 81 year old female who dropped a cabinet door on her foot now about 6 or 7 weeks ago complained of dorsal foot pain went to urgent care about 3 weeks after she dropped a cabinet on the foot.  She was placed in a postop shoe she is ambulatory.  She is still complaining of dorsal foot pain.  History reviewed. No pertinent past medical history.  BP 135/81   Pulse 97   Ht _0  (1.676 m)   Wt 212 lb (96.2 kg)   BMI 34.22 kg/m    General appearance: Well-developed well-nourished no gross deformities  Cardiovascular normal pulse and perfusion normal color without edema  Neurologically no sensation loss or deficits or pathologic reflexes  Psychological: Awake alert and oriented x3 mood and affect normal  Skin no lacerations or ulcerations no nodularity no palpable masses, no erythema or nodularity  Musculoskeletal: Pain left foot over the dorsum with some swelling no crepitance slight skin discoloration  [facility] no fracture I do not have the films the x-rays are repeated today  A/P  Urgent care records were reviewed along with x-ray report from urgent care patient's history is confirmed  Multiple views of the left foot are obtained 3 rd met fracture left foot  Wbat in HS shoe   Return in 4 wks for xrays

## 2021-10-24 NOTE — Patient Instructions (Signed)
Elevate the foot to control swelling   Apply ice packs for 20 min as needed   Wear hard sole shoe until pain completely subsides

## 2021-11-21 ENCOUNTER — Ambulatory Visit (INDEPENDENT_AMBULATORY_CARE_PROVIDER_SITE_OTHER): Payer: Medicare Other

## 2021-11-21 ENCOUNTER — Encounter: Payer: Self-pay | Admitting: Orthopedic Surgery

## 2021-11-21 ENCOUNTER — Ambulatory Visit (INDEPENDENT_AMBULATORY_CARE_PROVIDER_SITE_OTHER): Payer: Medicare Other | Admitting: Orthopedic Surgery

## 2021-11-21 DIAGNOSIS — S92335D Nondisplaced fracture of third metatarsal bone, left foot, subsequent encounter for fracture with routine healing: Secondary | ICD-10-CM | POA: Diagnosis not present

## 2021-11-21 DIAGNOSIS — M79672 Pain in left foot: Secondary | ICD-10-CM

## 2021-11-21 NOTE — Progress Notes (Signed)
Chief Complaint  Patient presents with   Foot Injury    LT  Closed nondisplaced fracture third metatarsal bone of left foot   FRX CARE  Encounter Diagnosis  Name Primary?   Pain in left foot Yes    XRAY F/U 3 RD MET FRX   Shes doing well and walking in the hard shoe with less pain   Exam minor tenderness to palpation of the frx site   Xrays show fracture callous around the injury   Rec transition to [place] and walking ok in proper footware   Xrays in 4 weeks

## 2021-12-02 ENCOUNTER — Encounter: Payer: Medicare Other | Admitting: Orthopedic Surgery

## 2021-12-16 DIAGNOSIS — S92335A Nondisplaced fracture of third metatarsal bone, left foot, initial encounter for closed fracture: Secondary | ICD-10-CM | POA: Insufficient documentation

## 2021-12-19 ENCOUNTER — Ambulatory Visit (INDEPENDENT_AMBULATORY_CARE_PROVIDER_SITE_OTHER): Payer: Medicare Other | Admitting: Orthopedic Surgery

## 2021-12-19 ENCOUNTER — Ambulatory Visit (INDEPENDENT_AMBULATORY_CARE_PROVIDER_SITE_OTHER): Payer: Medicare Other

## 2021-12-19 ENCOUNTER — Encounter: Payer: Self-pay | Admitting: Orthopedic Surgery

## 2021-12-19 DIAGNOSIS — S92335D Nondisplaced fracture of third metatarsal bone, left foot, subsequent encounter for fracture with routine healing: Secondary | ICD-10-CM

## 2021-12-19 NOTE — Progress Notes (Signed)
Chief Complaint  Patient presents with   Foot Injury    LT foot/ fx care DOI ~first week of June   Encounter Diagnosis  Name Primary?   Closed nondisplaced fracture of third metatarsal bone of left foot with routine healing, subsequent encounter Estimated date of injury first week of June Yes   Imaging and clinical exam indicate fracture healing  Patient released

## 2022-01-07 ENCOUNTER — Telehealth: Payer: Self-pay | Admitting: Orthopedic Surgery

## 2022-01-07 NOTE — Telephone Encounter (Signed)
Call received from patient; has questions about possible injections for her hands. Please advise. Phone: [phone]

## 2022-01-09 NOTE — Telephone Encounter (Signed)
Called back to patient; discussed further, new problem for Dr Aline Brochure to see her for, states she and Dr Aline Brochure discussed it. Patient relays she has seen Dr Amedeo Plenty, orthopaedic hand specialist at [facility] two times, most recently in April of 2023. Per Abigail Butts, patient is to request office notes from [facility]; patient said will do this today, and will call back when records are received, in order to try to schedule here.

## 2022-01-29 ENCOUNTER — Telehealth: Payer: Self-pay | Admitting: Radiology

## 2022-01-29 NOTE — Telephone Encounter (Signed)
Can we get kenalog ?

## 2022-01-29 NOTE — Telephone Encounter (Signed)
This was emailed from patient.  Please advise.   Dr. Aline Brochure, I have my records from previous hand injections. The medicine we asked for is Kenalog Reason being I had it in [place] and it worked right away. The shots have been very painful here with Dr. Jearld Fenton so I am hoping you will be better for me. I respect Dr. Jearld Fenton etc. but dread continuing my shots there. My right thumb area is the worst so I would like to make an appointment if your assistant can tell me if you will use the same medicine or what your procedure would be. Many thanks, Laura Burges [name] [phone] text and cell

## 2022-01-29 NOTE — Telephone Encounter (Signed)
Ok if we can get it we can do the injections

## 2022-02-03 NOTE — Telephone Encounter (Signed)
Emailed to patient and I will order the kenalog.

## 2022-02-20 IMAGING — DX DG PORTABLE PELVIS
2 series · 2 of 2 positions shown · non-contrast
Comparison: Left hip x-rays from yesterday.

CLINICAL DATA: Left hip replacement.

EXAM:
PORTABLE PELVIS 1-2 VIEWS

[pelvis ap (1 of 2)]
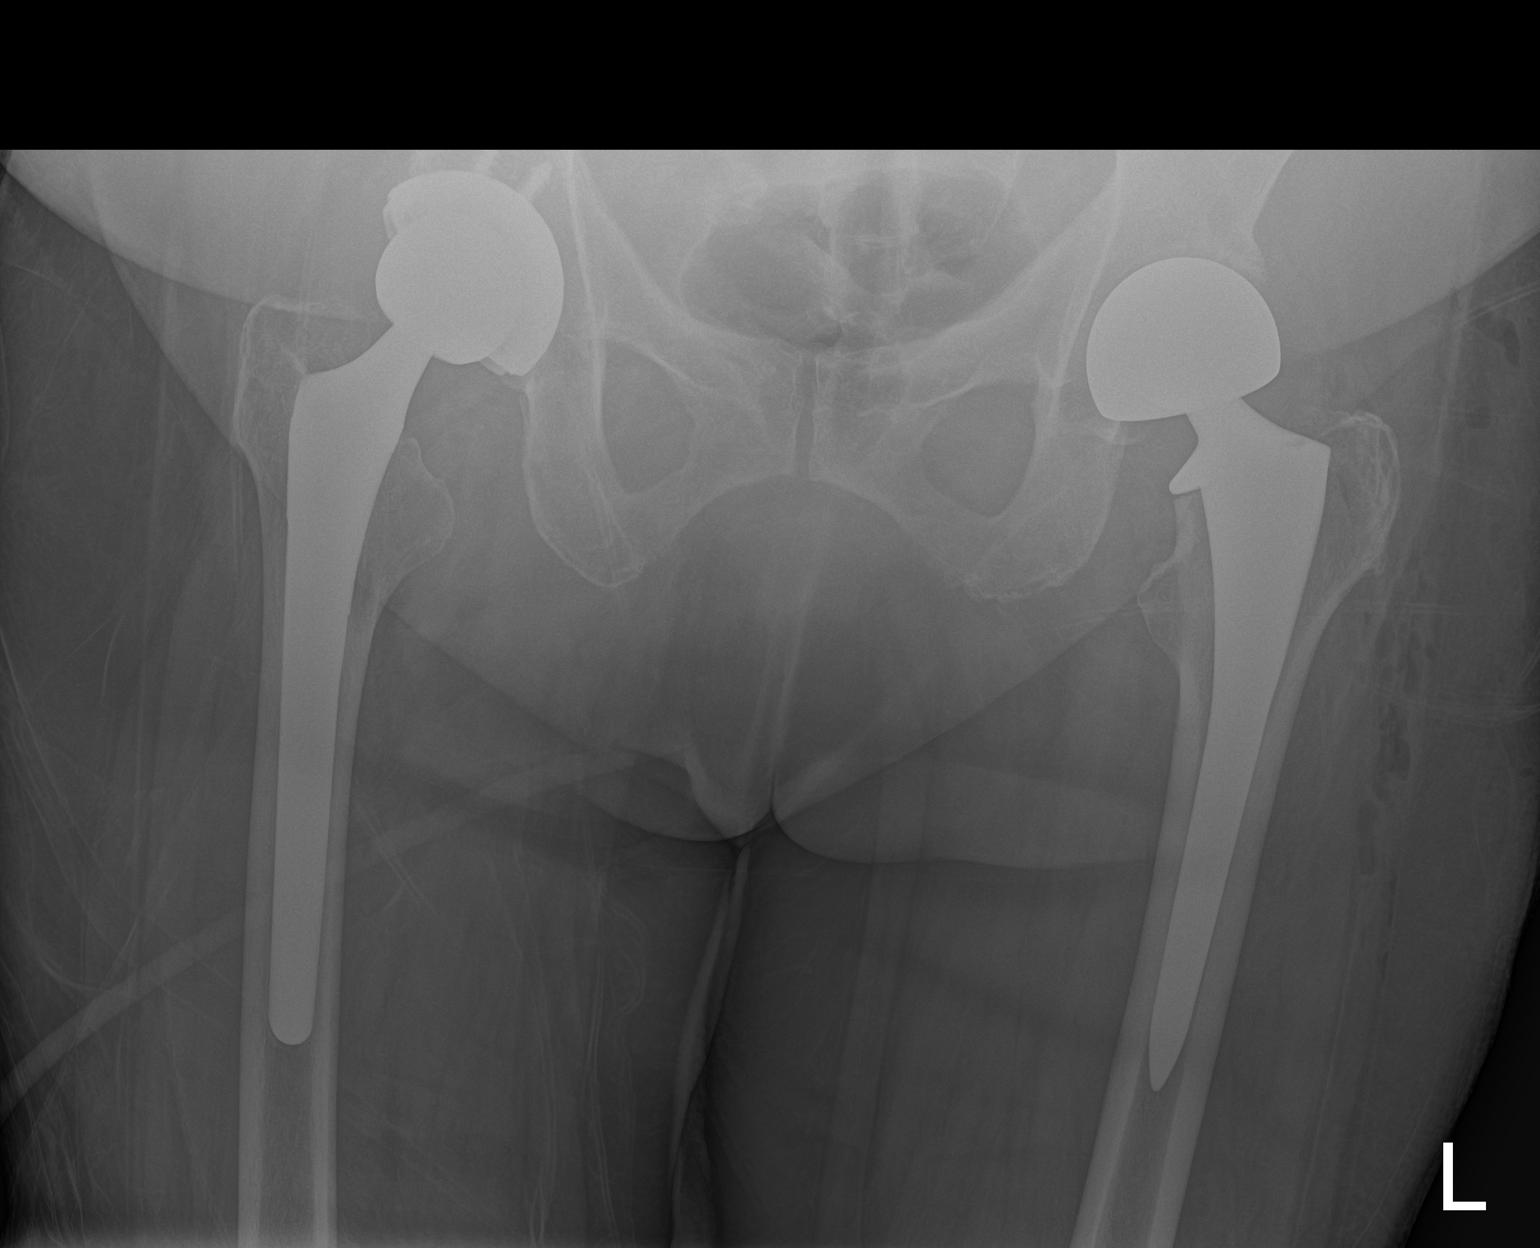

[pelvis ap (2 of 2)]
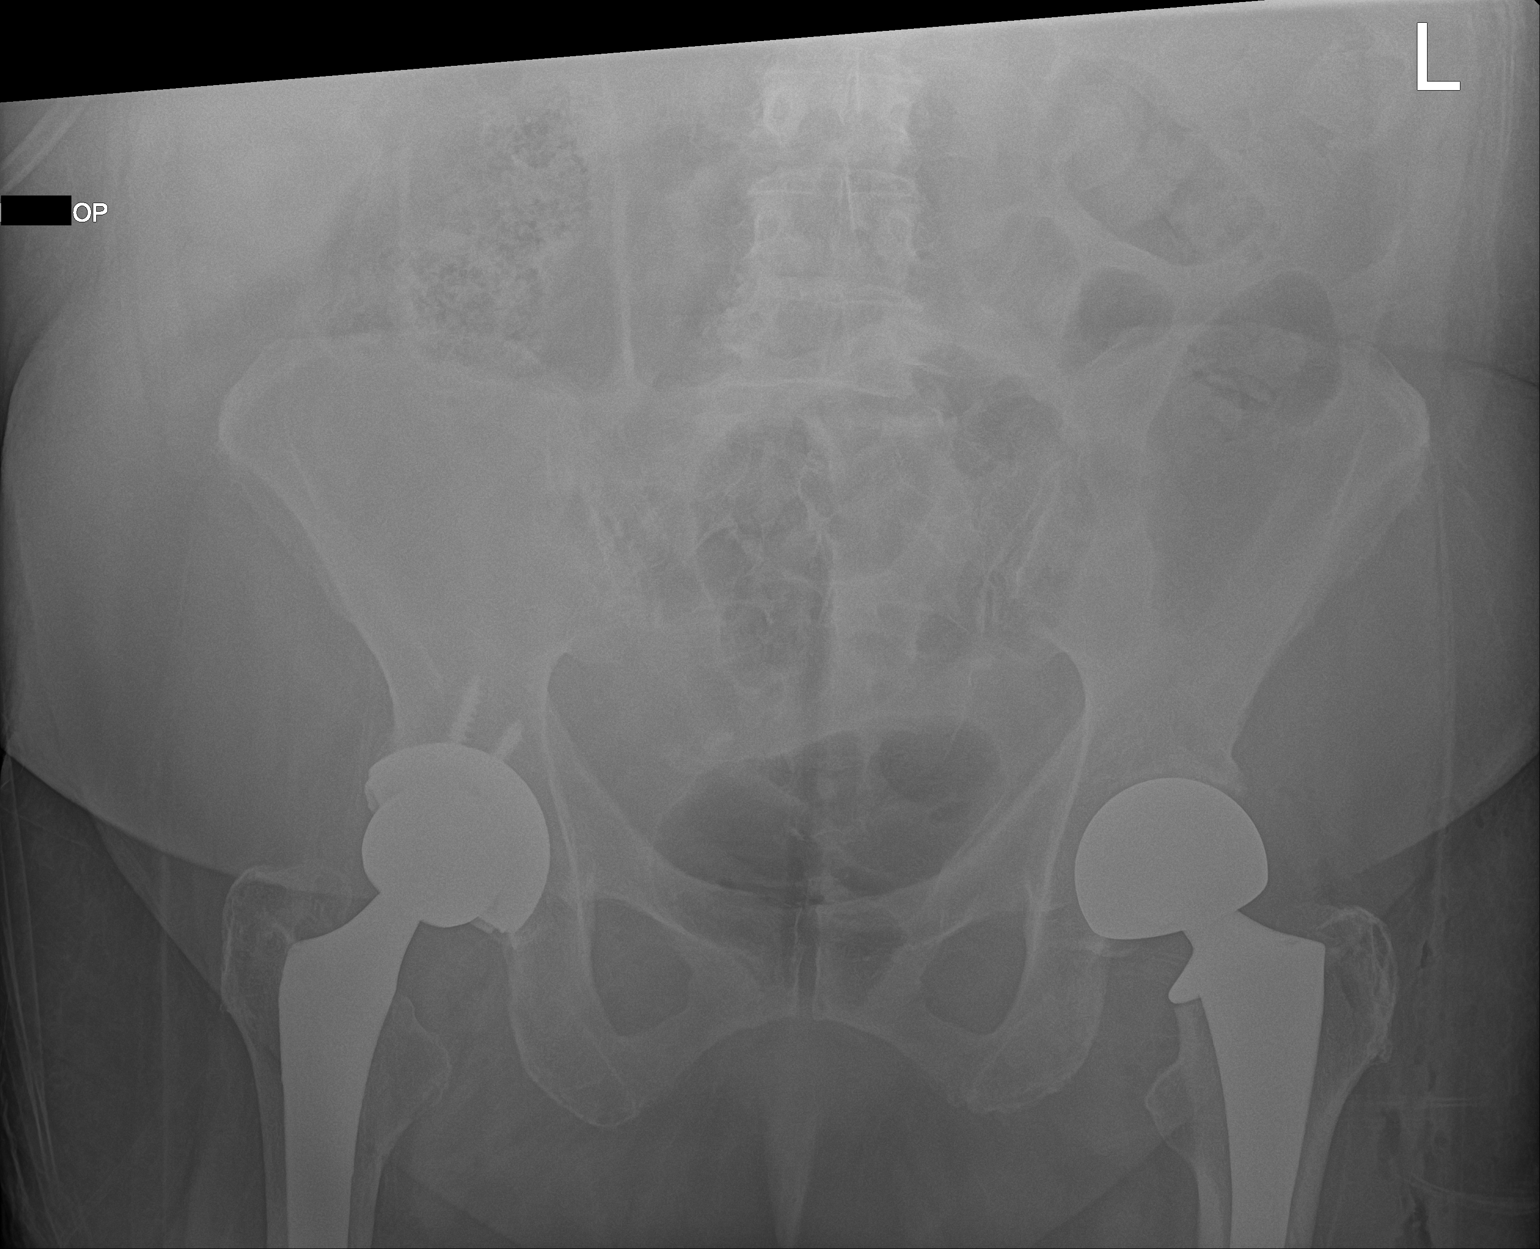

[2 of 2 positions shown; findings below may reference images not displayed]

FINDINGS: The left hip demonstrates a hemiarthroplasty without evidence of
hardware failure or complication. There is no fracture or
dislocation. The alignment is anatomic. Post-surgical changes noted
in the surrounding soft tissues. Prior right total hip arthroplasty.
IMPRESSION: 1. Interval left hip hemiarthroplasty without acute postoperative
complication.

## 2022-02-22 IMAGING — DX DG CHEST 1V PORT
1 series · 1 of 1 positions shown · non-contrast
Comparison: 07/04/2020

CLINICAL DATA: Shortness of breath, weakness

EXAM:
PORTABLE CHEST 1 VIEW

[chest ap]
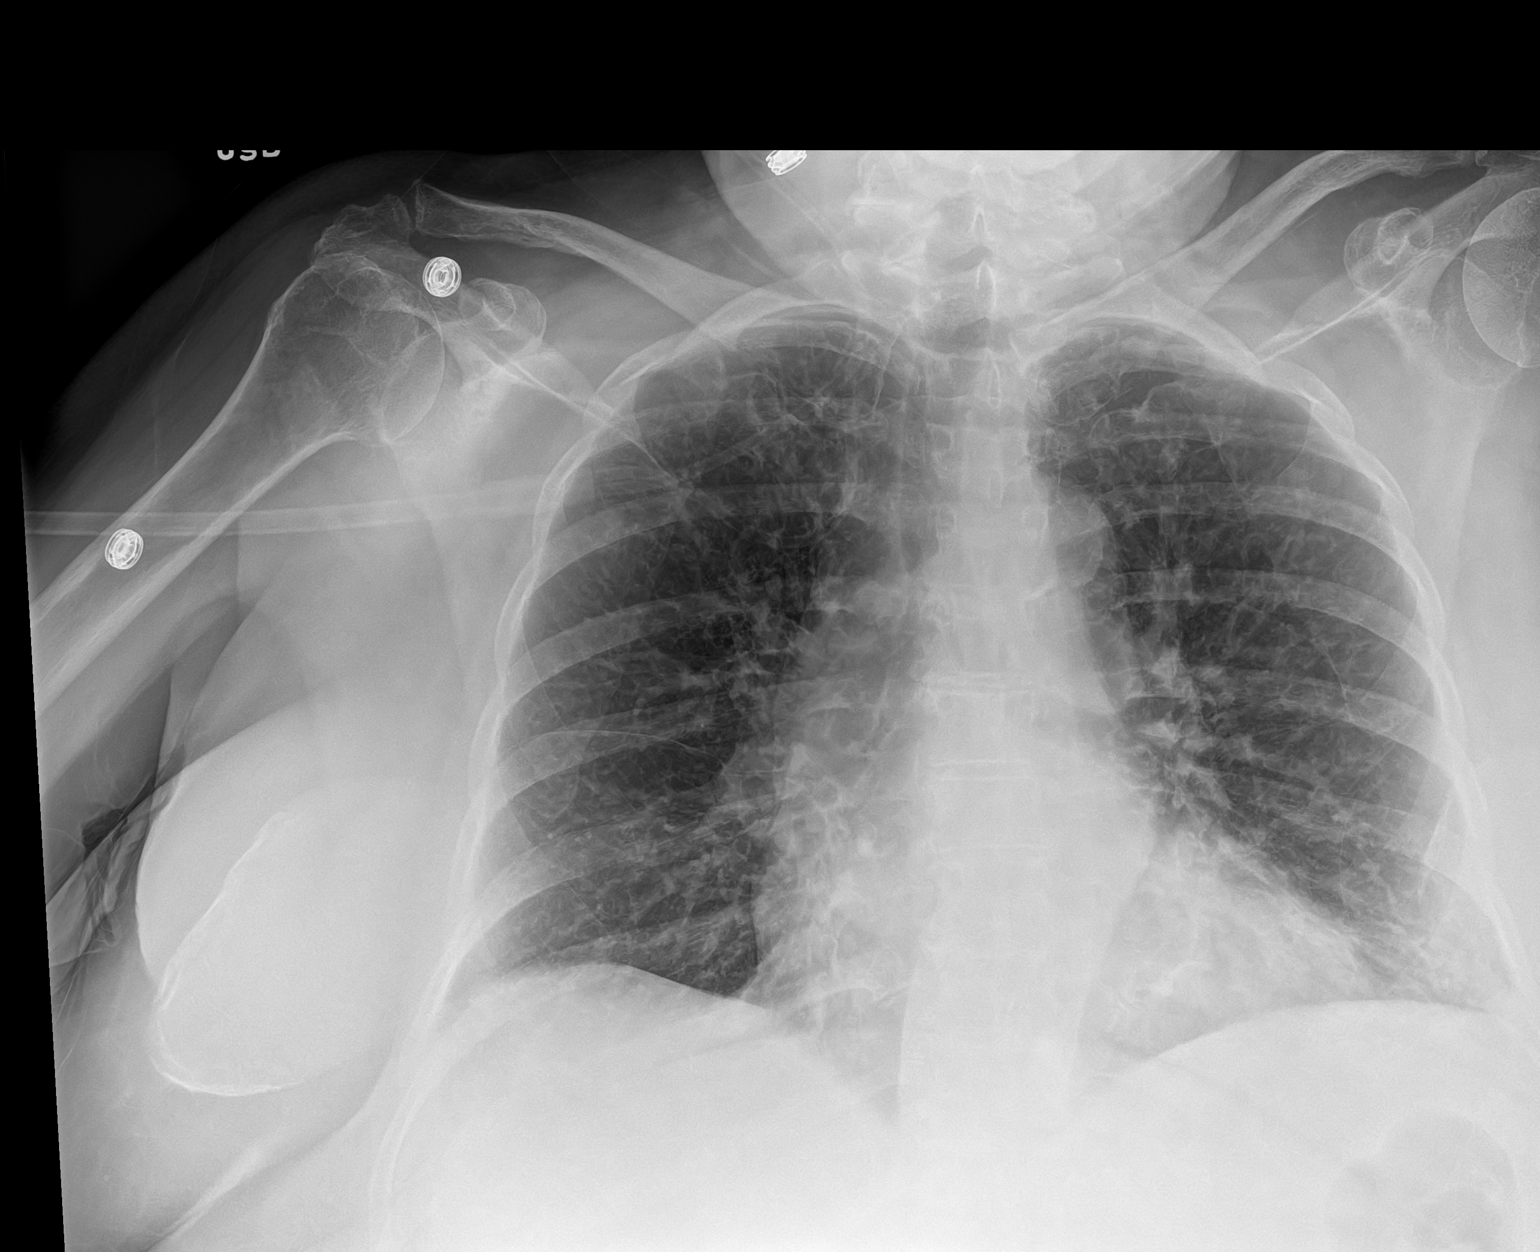

[1 of 1 positions shown; findings below may reference images not displayed]

FINDINGS: Improved aeration throughout the right lung. No confluent opacity on
the right. Left basilar opacity is increased and could reflect
atelectasis or infiltrate. Heart is upper limits normal in size. No
effusions.
IMPRESSION: Improved aeration on the right. Increasing left basilar atelectasis
or infiltrate.

## 2022-06-18 ENCOUNTER — Encounter: Payer: Self-pay | Admitting: Radiology

## 2022-12-11 ENCOUNTER — Encounter: Payer: Self-pay | Admitting: Orthopedic Surgery
